# Patient Record
Sex: Female | Born: 1950 | Race: White | Hispanic: No | Marital: Single | Smoking: Former smoker
Health system: Southern US, Community
[De-identification: ages and names within clinical notes are randomized; demographics above are authoritative.]

## PROBLEM LIST (undated history)

## (undated) DIAGNOSIS — E119 Type 2 diabetes mellitus without complications: Secondary | ICD-10-CM

## (undated) DIAGNOSIS — E785 Hyperlipidemia, unspecified: Secondary | ICD-10-CM

## (undated) DIAGNOSIS — I4891 Unspecified atrial fibrillation: Secondary | ICD-10-CM

## (undated) DIAGNOSIS — I2510 Atherosclerotic heart disease of native coronary artery without angina pectoris: Secondary | ICD-10-CM

## (undated) HISTORY — PX: TUBAL LIGATION: SHX77

## (undated) HISTORY — PX: CORONARY ARTERY BYPASS GRAFT: SHX141

## (undated) HISTORY — PX: ARM AMPUTATION THROUGH FOREARM: SHX553

---

## 2012-06-12 ENCOUNTER — Emergency Department: Payer: Self-pay | Admitting: Emergency Medicine

## 2015-07-27 ENCOUNTER — Other Ambulatory Visit: Payer: Self-pay | Admitting: Family Medicine

## 2015-07-27 DIAGNOSIS — M7989 Other specified soft tissue disorders: Secondary | ICD-10-CM

## 2015-07-28 ENCOUNTER — Ambulatory Visit
Admission: RE | Admit: 2015-07-28 | Discharge: 2015-07-28 | Disposition: A | Discharge status: Home / Self Care | Payer: Managed Care, Other (non HMO) | Source: Ambulatory Visit | Attending: Family Medicine | Admitting: Family Medicine

## 2015-07-28 DIAGNOSIS — M7989 Other specified soft tissue disorders: Secondary | ICD-10-CM | POA: Insufficient documentation

## 2018-11-01 ENCOUNTER — Inpatient Hospital Stay: Payer: Medicare Other | Admitting: Anesthesiology

## 2018-11-01 ENCOUNTER — Encounter: Payer: Self-pay | Admitting: Emergency Medicine

## 2018-11-01 ENCOUNTER — Emergency Department: Payer: Medicare Other

## 2018-11-01 ENCOUNTER — Inpatient Hospital Stay
Admission: EM | Admit: 2018-11-01 | Discharge: 2018-11-04 | DRG: 854 | Disposition: A | Discharge status: Home / Self Care | Payer: Medicare Other | Attending: Internal Medicine | Admitting: Internal Medicine

## 2018-11-01 ENCOUNTER — Encounter
Admission: EM | Disposition: A | Discharge status: Home / Self Care | Payer: Self-pay | Source: Home / Self Care | Attending: Internal Medicine

## 2018-11-01 DIAGNOSIS — Z79890 Hormone replacement therapy (postmenopausal): Secondary | ICD-10-CM

## 2018-11-01 DIAGNOSIS — I4891 Unspecified atrial fibrillation: Secondary | ICD-10-CM | POA: Diagnosis present

## 2018-11-01 DIAGNOSIS — N200 Calculus of kidney: Secondary | ICD-10-CM

## 2018-11-01 DIAGNOSIS — I2510 Atherosclerotic heart disease of native coronary artery without angina pectoris: Secondary | ICD-10-CM | POA: Diagnosis present

## 2018-11-01 DIAGNOSIS — N1339 Other hydronephrosis: Secondary | ICD-10-CM | POA: Diagnosis not present

## 2018-11-01 DIAGNOSIS — Z7984 Long term (current) use of oral hypoglycemic drugs: Secondary | ICD-10-CM

## 2018-11-01 DIAGNOSIS — Z951 Presence of aortocoronary bypass graft: Secondary | ICD-10-CM | POA: Diagnosis not present

## 2018-11-01 DIAGNOSIS — Z87891 Personal history of nicotine dependence: Secondary | ICD-10-CM | POA: Diagnosis not present

## 2018-11-01 DIAGNOSIS — E039 Hypothyroidism, unspecified: Secondary | ICD-10-CM | POA: Diagnosis present

## 2018-11-01 DIAGNOSIS — Z6841 Body Mass Index (BMI) 40.0 and over, adult: Secondary | ICD-10-CM | POA: Diagnosis not present

## 2018-11-01 DIAGNOSIS — E785 Hyperlipidemia, unspecified: Secondary | ICD-10-CM

## 2018-11-01 DIAGNOSIS — B9620 Unspecified Escherichia coli [E. coli] as the cause of diseases classified elsewhere: Secondary | ICD-10-CM | POA: Diagnosis present

## 2018-11-01 DIAGNOSIS — N136 Pyonephrosis: Secondary | ICD-10-CM | POA: Diagnosis present

## 2018-11-01 DIAGNOSIS — N390 Urinary tract infection, site not specified: Secondary | ICD-10-CM

## 2018-11-01 DIAGNOSIS — E669 Obesity, unspecified: Secondary | ICD-10-CM | POA: Diagnosis present

## 2018-11-01 DIAGNOSIS — R1032 Left lower quadrant pain: Secondary | ICD-10-CM | POA: Diagnosis present

## 2018-11-01 DIAGNOSIS — E119 Type 2 diabetes mellitus without complications: Secondary | ICD-10-CM | POA: Diagnosis present

## 2018-11-01 DIAGNOSIS — N201 Calculus of ureter: Secondary | ICD-10-CM | POA: Diagnosis not present

## 2018-11-01 DIAGNOSIS — Z7982 Long term (current) use of aspirin: Secondary | ICD-10-CM

## 2018-11-01 DIAGNOSIS — Q6211 Congenital occlusion of ureteropelvic junction: Secondary | ICD-10-CM

## 2018-11-01 DIAGNOSIS — N132 Hydronephrosis with renal and ureteral calculous obstruction: Secondary | ICD-10-CM | POA: Diagnosis not present

## 2018-11-01 DIAGNOSIS — A419 Sepsis, unspecified organism: Principal | ICD-10-CM

## 2018-11-01 DIAGNOSIS — N10 Acute tubulo-interstitial nephritis: Secondary | ICD-10-CM

## 2018-11-01 HISTORY — DX: Atherosclerotic heart disease of native coronary artery without angina pectoris: I25.10

## 2018-11-01 HISTORY — PX: CYSTOSCOPY WITH STENT PLACEMENT: SHX5790

## 2018-11-01 HISTORY — DX: Hyperlipidemia, unspecified: E78.5

## 2018-11-01 HISTORY — DX: Unspecified atrial fibrillation: I48.91

## 2018-11-01 HISTORY — DX: Type 2 diabetes mellitus without complications: E11.9

## 2018-11-01 LAB — CBC
HCT: 42 % (ref 36.0–46.0)
Hemoglobin: 13.9 g/dL (ref 12.0–15.0)
MCH: 30.6 pg (ref 26.0–34.0)
MCHC: 33.1 g/dL (ref 30.0–36.0)
MCV: 92.5 fL (ref 80.0–100.0)
Platelets: 366 10*3/uL (ref 150–400)
RBC: 4.54 MIL/uL (ref 3.87–5.11)
RDW: 13.1 % (ref 11.5–15.5)
WBC: 18 10*3/uL — ABNORMAL HIGH (ref 4.0–10.5)
nRBC: 0 % (ref 0.0–0.2)

## 2018-11-01 LAB — URINALYSIS, COMPLETE (UACMP) WITH MICROSCOPIC
Bilirubin Urine: NEGATIVE
Glucose, UA: NEGATIVE mg/dL
Ketones, ur: 5 mg/dL — AB
Nitrite: POSITIVE — AB
Protein, ur: 30 mg/dL — AB
Specific Gravity, Urine: 1.031 — ABNORMAL HIGH (ref 1.005–1.030)
WBC, UA: >50 WBC/hpf — ABNORMAL HIGH (ref 0–5)
pH: 5 (ref 5.0–8.0)

## 2018-11-01 LAB — COMPREHENSIVE METABOLIC PANEL
ALT: 30 U/L (ref 0–44)
AST: 25 U/L (ref 15–41)
Albumin: 4 g/dL (ref 3.5–5.0)
Alkaline Phosphatase: 48 U/L (ref 38–126)
Anion gap: 13 (ref 5–15)
BUN: 16 mg/dL (ref 8–23)
CO2: 22 mmol/L (ref 22–32)
Calcium: 9.3 mg/dL (ref 8.9–10.3)
Chloride: 103 mmol/L (ref 98–111)
Creatinine, Ser: 1.19 mg/dL — ABNORMAL HIGH (ref 0.44–1.00)
GFR calc Af Amer: 55 mL/min — ABNORMAL LOW (ref >60)
GFR calc non Af Amer: 48 mL/min — ABNORMAL LOW (ref >60)
Glucose, Bld: 154 mg/dL — ABNORMAL HIGH (ref 70–99)
Potassium: 3.4 mmol/L — ABNORMAL LOW (ref 3.5–5.1)
Sodium: 138 mmol/L (ref 135–145)
Total Bilirubin: 1.2 mg/dL (ref 0.3–1.2)
Total Protein: 7.4 g/dL (ref 6.5–8.1)

## 2018-11-01 LAB — LIPASE, BLOOD: Lipase: 21 U/L (ref 11–51)

## 2018-11-01 SURGERY — CYSTOSCOPY, WITH STENT INSERTION
Anesthesia: General | Laterality: Bilateral

## 2018-11-01 MED ORDER — MORPHINE SULFATE (PF) 4 MG/ML IV SOLN
4.0000 mg | INTRAVENOUS | Status: DC | PRN
  Administered 2018-11-01: 4 mg via INTRAVENOUS
  Filled 2018-11-01: qty 1

## 2018-11-01 MED ORDER — HYDROCODONE-ACETAMINOPHEN 5-325 MG PO TABS
1.0000 | ORAL_TABLET | Freq: Four times a day (QID) | ORAL | Status: DC | PRN
  Administered 2018-11-02: 2 via ORAL
  Administered 2018-11-03: 1 via ORAL
  Filled 2018-11-01: qty 2
  Filled 2018-11-01: qty 1

## 2018-11-01 MED ORDER — PROPOFOL 10 MG/ML IV BOLUS
INTRAVENOUS | Status: AC
  Filled 2018-11-01: qty 20

## 2018-11-01 MED ORDER — SUCCINYLCHOLINE CHLORIDE 20 MG/ML IJ SOLN
INTRAMUSCULAR | Status: DC | PRN
  Administered 2018-11-01: 120 mg via INTRAVENOUS

## 2018-11-01 MED ORDER — PHENYLEPHRINE HCL 10 MG/ML IJ SOLN
INTRAMUSCULAR | Status: DC | PRN
  Administered 2018-11-01: 100 ug via INTRAVENOUS
  Administered 2018-11-01 (×2): 200 ug via INTRAVENOUS

## 2018-11-01 MED ORDER — PROPOFOL 10 MG/ML IV BOLUS
INTRAVENOUS | Status: DC | PRN
  Administered 2018-11-01: 130 mg via INTRAVENOUS
  Administered 2018-11-01: 70 mg via INTRAVENOUS

## 2018-11-01 MED ORDER — FENTANYL CITRATE (PF) 100 MCG/2ML IJ SOLN
INTRAMUSCULAR | Status: AC
  Filled 2018-11-01: qty 2

## 2018-11-01 MED ORDER — ACETAMINOPHEN 10 MG/ML IV SOLN
1000.0000 mg | Freq: Once | INTRAVENOUS | Status: DC
  Filled 2018-11-01: qty 100

## 2018-11-01 MED ORDER — IOPAMIDOL (ISOVUE-200) INJECTION 41%
INTRAVENOUS | Status: DC | PRN
  Administered 2018-11-01: 7 mL via INTRAVENOUS

## 2018-11-01 MED ORDER — ROCURONIUM BROMIDE 100 MG/10ML IV SOLN
INTRAVENOUS | Status: DC | PRN
  Administered 2018-11-01: 10 mg via INTRAVENOUS

## 2018-11-01 MED ORDER — IOHEXOL 300 MG/ML  SOLN
100.0000 mL | Freq: Once | INTRAMUSCULAR | Status: AC | PRN
  Administered 2018-11-01: 100 mL via INTRAVENOUS

## 2018-11-01 MED ORDER — FENTANYL CITRATE (PF) 100 MCG/2ML IJ SOLN: 25.0000 ug | INTRAMUSCULAR | Status: DC | PRN

## 2018-11-01 MED ORDER — ONDANSETRON HCL 4 MG/2ML IJ SOLN: 4.0000 mg | Freq: Once | INTRAMUSCULAR | Status: DC | PRN

## 2018-11-01 MED ORDER — SODIUM CHLORIDE 0.9 % IV BOLUS
500.0000 mL | Freq: Once | INTRAVENOUS | Status: AC
  Administered 2018-11-01: 500 mL via INTRAVENOUS

## 2018-11-01 MED ORDER — SODIUM CHLORIDE 0.9 % IV SOLN
INTRAVENOUS | Status: DC | PRN
  Administered 2018-11-01: 23:00:00 via INTRAVENOUS

## 2018-11-01 MED ORDER — ONDANSETRON HCL 4 MG/2ML IJ SOLN
INTRAMUSCULAR | Status: DC | PRN
  Administered 2018-11-01: 4 mg via INTRAVENOUS

## 2018-11-01 MED ORDER — SODIUM CHLORIDE 0.9 % IV BOLUS
250.0000 mL | Freq: Once | INTRAVENOUS | Status: AC
  Administered 2018-11-01: 250 mL via INTRAVENOUS

## 2018-11-01 MED ORDER — FENTANYL CITRATE (PF) 100 MCG/2ML IJ SOLN
INTRAMUSCULAR | Status: DC | PRN
  Administered 2018-11-01 (×2): 50 ug via INTRAVENOUS

## 2018-11-01 MED ORDER — MIDAZOLAM HCL 2 MG/2ML IJ SOLN
INTRAMUSCULAR | Status: DC | PRN
  Administered 2018-11-01 (×2): 1 mg via INTRAVENOUS

## 2018-11-01 MED ORDER — LIDOCAINE HCL (CARDIAC) PF 100 MG/5ML IV SOSY
PREFILLED_SYRINGE | INTRAVENOUS | Status: DC | PRN
  Administered 2018-11-01: 100 mg via INTRAVENOUS

## 2018-11-01 MED ORDER — PROMETHAZINE HCL 25 MG/ML IJ SOLN
12.5000 mg | Freq: Four times a day (QID) | INTRAMUSCULAR | Status: DC | PRN
  Administered 2018-11-01: 12.5 mg via INTRAVENOUS
  Filled 2018-11-01: qty 1

## 2018-11-01 MED ORDER — MIDAZOLAM HCL 2 MG/2ML IJ SOLN
INTRAMUSCULAR | Status: AC
  Filled 2018-11-01: qty 2

## 2018-11-01 MED ORDER — SODIUM CHLORIDE 0.9 % IV SOLN
1.0000 g | Freq: Once | INTRAVENOUS | Status: AC
  Administered 2018-11-01: 1 g via INTRAVENOUS
  Filled 2018-11-01: qty 10

## 2018-11-01 MED ORDER — ONDANSETRON 4 MG PO TBDP
4.0000 mg | ORAL_TABLET | Freq: Once | ORAL | Status: AC
  Administered 2018-11-01: 4 mg via ORAL
  Filled 2018-11-01: qty 1

## 2018-11-01 SURGICAL SUPPLY — 21 items
BAG DRAIN CYSTO-URO LG1000N (MISCELLANEOUS) ×2 IMPLANT
BRUSH SCRUB EZ  4% CHG (MISCELLANEOUS)
BRUSH SCRUB EZ 4% CHG (MISCELLANEOUS) IMPLANT
CATH URETL 5X70 OPEN END (CATHETERS) ×2 IMPLANT
CNTNR SPEC 2.5X3XGRAD LEK (MISCELLANEOUS) ×2
CONT SPEC 4OZ STER OR WHT (MISCELLANEOUS) ×2
CONTAINER SPEC 2.5X3XGRAD LEK (MISCELLANEOUS) ×2 IMPLANT
GLOVE BIO SURGEON STRL SZ8 (GLOVE) ×4 IMPLANT
GOWN STRL REUS W/ TWL XL LVL3 (GOWN DISPOSABLE) ×2 IMPLANT
GOWN STRL REUS W/TWL XL LVL3 (GOWN DISPOSABLE) ×2
GUIDEWIRE STR DUAL SENSOR (WIRE) ×2 IMPLANT
KIT TURNOVER CYSTO (KITS) ×2 IMPLANT
PACK CYSTO AR (MISCELLANEOUS) ×2 IMPLANT
SET CYSTO W/LG BORE CLAMP LF (SET/KITS/TRAYS/PACK) ×2 IMPLANT
SOL .9 NS 3000ML IRR  AL (IV SOLUTION) ×1
SOL .9 NS 3000ML IRR UROMATIC (IV SOLUTION) ×1 IMPLANT
STENT URET 6FRX24 CONTOUR (STENTS) ×4 IMPLANT
STENT URET 6FRX26 CONTOUR (STENTS) IMPLANT
SURGILUBE 2OZ TUBE FLIPTOP (MISCELLANEOUS) ×2 IMPLANT
SYR 20CC LL (SYRINGE) ×4 IMPLANT
WATER STERILE IRR 1000ML POUR (IV SOLUTION) ×2 IMPLANT

## 2018-11-01 NOTE — Op Note (Signed)
Preoperative diagnosis:  1. Left UPJ calculus-obstructing 2. Right UPJ calculus-obstructing 3. Pyelonephritis  Postoperative diagnosis:  1. Left UPJ calculus-obstructing 2. Right UPJ calculus-obstructing 3. Left pyonephrosis 4. Pyelonephritis  Procedure:  1. Cystoscopy 2. Bilateral ureteral stent placement (6FR) 24 cm 3. Bilateral retrograde pyelography with interpretation   Surgeon: Lorin Picket C. Stoioff, M.D.  Anesthesia: General  Complications: None  Intraoperative findings: 1.  Bilateral obstructing UPJ calculi 2.  Left pyonephrosis 3.  Left retrograde pyelogram-mild left hydronephrosis secondary to UPJ calculus 4.  Right retrograde pyelogram-moderate hydronephrosis secondary to UPJ calculus  EBL: Minimal  Specimens:  1.  Urine culture left renal pelvis 2.  Urine culture right renal pelvis  Indication: Sarah Brady is a 68 y.o. female presenting with a left lower quadrant abdominal pain, subjective fever, nausea and vomiting.  She presented to the ED where CT showed a 10 mm right UPJ calculus and a 16 mm left UPJ calculus with mild to moderate hydronephrosis.  Urinalysis was nitrite positive with pyuria and she had leukocytosis with a WBC of 18,000.  After reviewing the management options for treatment, he elected to proceed with the above surgical procedure(s). We have discussed the potential benefits and risks of the procedure, side effects of the proposed treatment, the likelihood of the patient achieving the goals of the procedure, and any potential problems that might occur during the procedure or recuperation. Informed consent has been obtained.  Description of procedure:  The patient was taken to the operating room and general anesthesia was induced.  The patient was placed in the dorsal lithotomy position, prepped and draped in the usual sterile fashion, and preoperative antibiotics were administered. A preoperative time-out was performed.   Cystourethroscopy was  performed.  The patient's urethra was examined and was normal. The bladder was then systematically examined in its entirety. There was no evidence for any bladder tumors, stones, or other mucosal pathology.  The ureteral orifice ease were normal appearing bilaterally and no efflux was seen after approximately 60 seconds of observation.  Attention then turned to the left ureteral orifice and a 0.038 sensor wire was placed to the cystoscope and into the left ureteral orifice and advanced to the renal pelvis under fluoroscopic guidance.  She had received IV contrast for her CT and bilateral hydronephrosis was noted on fluoroscopy.  A 5 French open-ended ureteral catheter was placed over the guidewire and advanced to the renal pelvis.  The guidewire was removed and thick purulent urine was aspirated from the renal pelvis.  Retrograde pyelogram was performed with findings as described above.  The guidewire was replaced and the ureteral catheter was removed.    A 6 French/24 cm double-J ureteral stent was then placed over the guidewire with good positioning noted in the renal pelvis under fluoroscopy and in the bladder under direct vision.    Attention was then directed to the right ureteral orifice.  A guidewire and ureteral catheter were placed in a similar fashion.  Yellow, slightly cloudy urine was aspirated from the right renal pelvis and sent for culture.  Right retrograde pyelogram was then performed with findings described above.  The guidewire was placed and the ureteral catheter was removed.  A 6 French/24 cm double-J ureteral stent was then placed over the guidewire with good positioning noted in the renal pelvis under fluoroscopy and in the bladder under direct vision.  The bladder was then emptied and the procedure ended.  The patient appeared to tolerate the procedure well and without complications.  After  anesthetic reversal patient was transported to the PACU in stable condition.   Irineo Axon, MD

## 2018-11-01 NOTE — H&P (Signed)
[facility] [place] at [facility]   PATIENT NAME: Sarah Brady    MR#:  [id number]  DATE OF BIRTH:  02-06-1951  DATE OF ADMISSION:  11/01/2018  PRIMARY CARE PHYSICIAN: Verner Mould, MD   REQUESTING/REFERRING PHYSICIAN: Roxan Hockey, MD  CHIEF COMPLAINT:   Chief Complaint  Patient presents with  . Abdominal Pain  . Nausea    HISTORY OF PRESENT ILLNESS:  Sarah Brady  is a 68 y.o. female who presents with chief complaint as above.  Patient presents the ED with bilateral abdominal and back pain.  She is found here to have bilateral obstructing kidney stones and UTI.  Hospitalist were called for admission after antibiotic administration was started in the ED and urology was contacted.  Plan is for bilateral stent placement by urology in the OR tonight.  PAST MEDICAL HISTORY:   Past Medical History:  Diagnosis Date  . Atrial fibrillation (HCC)   . CAD (coronary artery disease)   . Diabetes mellitus without complication (HCC)   . HLD (hyperlipidemia)      PAST SURGICAL HISTORY:   Past Surgical History:  Procedure Laterality Date  . CORONARY ARTERY BYPASS GRAFT    . TUBAL LIGATION       SOCIAL HISTORY:   Social History   Tobacco Use  . Smoking status: Former Smoker  Substance Use Topics  . Alcohol use: Yes    Alcohol/week: 1.0 standard drinks    Types: 1 Glasses of wine per week     FAMILY HISTORY:    Family history reviewed and is non-contributory DRUG ALLERGIES:  Not on File  MEDICATIONS AT HOME:   Prior to Admission medications   Not on File    REVIEW OF SYSTEMS:  Review of Systems  Constitutional: Positive for fever and malaise/fatigue. Negative for chills and weight loss.  HENT: Negative for ear pain, hearing loss and tinnitus.   Eyes: Negative for blurred vision, double vision, pain and redness.  Respiratory: Negative for cough, hemoptysis and shortness of breath.   Cardiovascular: Negative for chest pain,  palpitations, orthopnea and leg swelling.  Gastrointestinal: Negative for abdominal pain, constipation, diarrhea, nausea and vomiting.  Genitourinary: Positive for flank pain. Negative for dysuria, frequency and hematuria.  Musculoskeletal: Positive for back pain. Negative for joint pain and neck pain.  Skin:       No acne, rash, or lesions  Neurological: Negative for dizziness, tremors, focal weakness and weakness.  Endo/Heme/Allergies: Negative for polydipsia. Does not bruise/bleed easily.  Psychiatric/Behavioral: Negative for depression. The patient is not nervous/anxious and does not have insomnia.      VITAL SIGNS:   Vitals:   11/01/18 1930 11/01/18 2030 11/01/18 2039 11/01/18 2145  BP: (!) 141/75 (!) 146/73    Pulse: 98 99    Resp: (!) 21 16    Temp:    100.3 F (37.9 C)  TempSrc:    Oral  SpO2:   97%   Weight:      Height:       Wt Readings from Last 3 Encounters:  11/01/18 113.4 kg    PHYSICAL EXAMINATION:  Physical Exam  Vitals reviewed. Constitutional: She is oriented to person, place, and time. She appears well-developed and well-nourished. No distress.  HENT:  Head: Normocephalic and atraumatic.  Mouth/Throat: Oropharynx is clear and moist.  Eyes: Pupils are equal, round, and reactive to light. Conjunctivae and EOM are normal. No scleral icterus.  Neck: Normal range of motion. Neck supple. No JVD present.  No thyromegaly present.  Cardiovascular: Regular rhythm and intact distal pulses. Exam reveals no gallop and no friction rub.  No murmur heard. Borderline tachycardic  Respiratory: Effort normal and breath sounds normal. No respiratory distress. She has no wheezes. She has no rales.  GI: Soft. Bowel sounds are normal. She exhibits no distension. There is abdominal tenderness.  Musculoskeletal: Normal range of motion.        General: No edema.     Comments: No arthritis, no gout  Lymphadenopathy:    She has no cervical adenopathy.  Neurological: She is  alert and oriented to person, place, and time. No cranial nerve deficit.  No dysarthria, no aphasia  Skin: Skin is warm and dry. No rash noted. No erythema.  Psychiatric: She has a normal mood and affect. Her behavior is normal. Judgment and thought content normal.    LABORATORY PANEL:   CBC Recent Labs  Lab 11/01/18 1451  WBC 18.0*  HGB 13.9  HCT 42.0  PLT 366   ------------------------------------------------------------------------------------------------------------------  Chemistries  Recent Labs  Lab 11/01/18 1451  NA 138  K 3.4*  CL 103  CO2 22  GLUCOSE 154*  BUN 16  CREATININE 1.19*  CALCIUM 9.3  AST 25  ALT 30  ALKPHOS 48  BILITOT 1.2   ------------------------------------------------------------------------------------------------------------------  Cardiac Enzymes No results for input(s): TROPONINI in the last 168 hours. ------------------------------------------------------------------------------------------------------------------  RADIOLOGY:  Ct Abdomen Pelvis W Contrast  Result Date: 11/01/2018 CLINICAL DATA:  Nausea, vomiting, and left upper and lower abdominal/flank pain. EXAM: CT ABDOMEN AND PELVIS WITH CONTRAST TECHNIQUE: Multidetector CT imaging of the abdomen and pelvis was performed using the standard protocol following bolus administration of intravenous contrast. CONTRAST:  100mL OMNIPAQUE IOHEXOL 300 MG/ML  SOLN COMPARISON:  08/16/2009 FINDINGS: Lower chest: Mild atelectasis/scarring in the lung bases. Prior CABG. Coronary artery atherosclerosis. No pleural effusion. Hepatobiliary: Diffusely decreased attenuation of the liver consistent with steatosis. 3 cm stone in the gallbladder without evidence of acute cholecystitis or biliary dilatation. Pancreas: Unremarkable. Spleen: Unremarkable. Adrenals/Urinary Tract: Unremarkable adrenal glands. New bilateral ureteropelvic junction stones measure 10 mm on the right and approximately 16 mm on the left.  There is mild-to-moderate right greater than left hydronephrosis. Additional small calculi are present in the lower poles of both kidneys. There is asymmetric left perinephric stranding, and the left kidney appears edematous with diffuse hypoenhancement relative to the right and no contrast excretion on delayed imaging. No ureteral dilatation or ureteral calculi are identified, although the proximal right ureter appears mildly thick-walled near the UPJ stone. The bladder is unremarkable. Stomach/Bowel: There is a small sliding hiatal hernia. The stomach is decompressed. There is no evidence of bowel obstruction or inflammation. The appendix is likely located inferior to the cecum incompletely collapse without evidence of appendicitis. Vascular/Lymphatic: Aortic atherosclerosis without aneurysm. Increased number of subcentimeter aortocaval and left periaortic lymph nodes, most likely reactive. Reproductive: Low density centrally in the uterus which may reflect endometrial thickening or fluid with more bulbous low-density enlargement of the cervix. Three separate rounded intermediate density masses in the pelvis with the largest measuring 12 x 11 cm and likely arising from the right adnexa. The mass contacts and mildly deforms the uterine fundus. Unremarkable left adnexa. Other: No intraperitoneal free fluid. Musculoskeletal: No acute osseous abnormality or suspicious osseous lesion. IMPRESSION: 1. Bilateral ureteropelvic junction stones with mild-to-moderate bilateral hydronephrosis. Asymmetric left renal edema and lack of left renal excretion. 2. Bilateral nonobstructing renal calculi. 3. Multiple pelvic masses measuring up to 12 cm  in size and possibly arising from the right ovary. Possible endometrial thickening or mass. Pelvic ultrasound is recommended to further evaluate these findings. 4. Hepatic steatosis. 5. Cholelithiasis. 6.  Aortic Atherosclerosis (ICD10-I70.0). Electronically Signed   By: Sebastian Ache  M.D.   On: 11/01/2018 20:50    EKG:   Orders placed or performed during the hospital encounter of 11/01/18  . ED EKG  . ED EKG    IMPRESSION AND PLAN:  Active Problems:   Sepsis (HCC) -due to her UTI.  IV antibiotics given, blood pressure stable, urology contacted for treatment of kidney stones as below.   Kidney stone -bilaterally, urology plans for bilateral stent placement tonight   UTI (urinary tract infection) -IV antibiotics, urine culture sent   CAD (coronary artery disease) -continue home meds   HLD (hyperlipidemia) -home dose antilipid  Chart review performed and case discussed with ED provider. Labs, imaging and/or ECG reviewed by provider and discussed with patient/family. Management plans discussed with the patient and/or family.  DVT PROPHYLAXIS: SubQ lovenox   GI PROPHYLAXIS:  None  ADMISSION STATUS: Inpatient     CODE STATUS: Full  TOTAL TIME TAKING CARE OF THIS PATIENT: 45 minutes.   Barney Drain 11/01/2018, 9:54 PM  [facility]  Office  [phone]  CC: Primary care physician; Verner Mould, MD  Note:  This document was prepared using Dragon voice recognition software and may include unintentional dictation errors.

## 2018-11-01 NOTE — Anesthesia Post-op Follow-up Note (Signed)
Anesthesia QCDR form completed.        

## 2018-11-01 NOTE — ED Notes (Signed)
Patient transported to CT 

## 2018-11-01 NOTE — Consult Note (Signed)
Urology Consult  Chief Complaint: Abdominal pain  History of Present Illness: Sarah Brady is a 68 y.o. year old female seen in consultation at request of Dr. Roxan Hockey for bilateral UPJ calculi.  Earlier this week she had onset of left lower quadrant abdominal pain radiating to the left flank.  As the week progressed her pain increased and was associated with nausea and vomiting.  She has had subjective low-grade fever.  Due to increasing pain, nausea and vomiting she presented to the ED this evening.  A stone protocol CT of the abdomen and pelvis was performed which showed bilateral obstructing UPJ calculi with mild to moderate bilateral hydronephrosis R >L.  Urinalysis was nitrite positive with pyuria and WBC was elevated at 18,000.  She also had bilateral nonobstructing lower pole urinary calculi.  She has a previous history of stone disease not requiring intervention.  She received IV Rocephin in the ED.  Past Medical History:  Diagnosis Date  . Atrial fibrillation (HCC)   . CAD (coronary artery disease)   . Diabetes mellitus without complication (HCC)   . HLD (hyperlipidemia)     Past Surgical History:  Procedure Laterality Date  . CORONARY ARTERY BYPASS GRAFT    . TUBAL LIGATION      Home Medications:  Current Meds  Medication Sig  . atorvastatin (LIPITOR) 80 MG tablet Take 80 mg by mouth daily.  Marland Kitchen buPROPion (WELLBUTRIN XL) 150 MG 24 hr tablet Take 150 mg by mouth daily.  Marland Kitchen buPROPion (WELLBUTRIN XL) 300 MG 24 hr tablet Take 300 mg by mouth daily.  . clobetasol ointment (TEMOVATE) 0.05 % Apply 0.05 application topically as needed.  . furosemide (LASIX) 40 MG tablet Take 40 mg by mouth 2 (two) times daily.  Marland Kitchen levothyroxine (SYNTHROID, LEVOTHROID) 125 MCG tablet Take 125 mcg by mouth daily.  . metFORMIN (GLUCOPHAGE-XR) 500 MG 24 hr tablet Take 1,000 mg by mouth daily. With largest meal of the day.  . metoprolol tartrate (LOPRESSOR) 25 MG tablet Take 25 mg by mouth 2  (two) times daily.  . potassium chloride (K-DUR,KLOR-CON) 10 MEQ tablet Take 10 mEq by mouth 2 (two) times daily.  . Vitamin D, Ergocalciferol, (DRISDOL) 1.25 MG (50000 UT) CAPS capsule Take 50,000 Units by mouth 2 (two) times a week.    Allergies: Not on File  No family history on file.  Social History:  reports that she has quit smoking. She does not have any smokeless tobacco history on file. She reports current alcohol use of about 1.0 standard drinks of alcohol per week. She reports previous drug use.  ROS: A complete review of systems was performed.  All systems are negative except for pertinent findings as noted.  Physical Exam:  Vital signs in last 24 hours: Temp:  [100.3 F (37.9 C)] 100.3 F (37.9 C) (02/20 2145) Pulse Rate:  [98-99] 99 (02/20 2130) Resp:  [16-30] 30 (02/20 2130) BP: (141-152)/(68-97) 152/97 (02/20 2130) SpO2:  [88 %-97 %] 94 % (02/20 2130) Weight:  [113.4 kg] 113.4 kg (02/20 1444) Constitutional:  Alert and oriented, No acute distress HEENT: [place] AT, moist mucus membranes.  Trachea midline, no masses Cardiovascular: Regular rate and rhythm, no clubbing, cyanosis, or edema. Respiratory: Normal respiratory effort, lungs clear bilaterally GI: Abdomen is soft, mild left lower quadrant tenderness, nondistended, no abdominal masses GU: No CVA tenderness Skin: No rashes, bruises or suspicious lesions Lymph: No cervical or inguinal adenopathy Neurologic: Grossly intact, no focal deficits, moving all 4 extremities Psychiatric:  Normal mood and affect   Laboratory Data:  Recent Labs    11/01/18 1451  WBC 18.0*  HGB 13.9  HCT 42.0   Recent Labs    11/01/18 1451  NA 138  K 3.4*  CL 103  CO2 22  GLUCOSE 154*  BUN 16  CREATININE 1.19*  CALCIUM 9.3    Radiologic Imaging: CT images were personally reviewed  Ct Abdomen Pelvis W Contrast  Result Date: 11/01/2018 CLINICAL DATA:  Nausea, vomiting, and left upper and lower abdominal/flank pain. EXAM:  CT ABDOMEN AND PELVIS WITH CONTRAST TECHNIQUE: Multidetector CT imaging of the abdomen and pelvis was performed using the standard protocol following bolus administration of intravenous contrast. CONTRAST:  100mL OMNIPAQUE IOHEXOL 300 MG/ML  SOLN COMPARISON:  08/16/2009 FINDINGS: Lower chest: Mild atelectasis/scarring in the lung bases. Prior CABG. Coronary artery atherosclerosis. No pleural effusion. Hepatobiliary: Diffusely decreased attenuation of the liver consistent with steatosis. 3 cm stone in the gallbladder without evidence of acute cholecystitis or biliary dilatation. Pancreas: Unremarkable. Spleen: Unremarkable. Adrenals/Urinary Tract: Unremarkable adrenal glands. New bilateral ureteropelvic junction stones measure 10 mm on the right and approximately 16 mm on the left. There is mild-to-moderate right greater than left hydronephrosis. Additional small calculi are present in the lower poles of both kidneys. There is asymmetric left perinephric stranding, and the left kidney appears edematous with diffuse hypoenhancement relative to the right and no contrast excretion on delayed imaging. No ureteral dilatation or ureteral calculi are identified, although the proximal right ureter appears mildly thick-walled near the UPJ stone. The bladder is unremarkable. Stomach/Bowel: There is a small sliding hiatal hernia. The stomach is decompressed. There is no evidence of bowel obstruction or inflammation. The appendix is likely located inferior to the cecum incompletely collapse without evidence of appendicitis. Vascular/Lymphatic: Aortic atherosclerosis without aneurysm. Increased number of subcentimeter aortocaval and left periaortic lymph nodes, most likely reactive. Reproductive: Low density centrally in the uterus which may reflect endometrial thickening or fluid with more bulbous low-density enlargement of the cervix. Three separate rounded intermediate density masses in the pelvis with the largest measuring 12  x 11 cm and likely arising from the right adnexa. The mass contacts and mildly deforms the uterine fundus. Unremarkable left adnexa. Other: No intraperitoneal free fluid. Musculoskeletal: No acute osseous abnormality or suspicious osseous lesion. IMPRESSION: 1. Bilateral ureteropelvic junction stones with mild-to-moderate bilateral hydronephrosis. Asymmetric left renal edema and lack of left renal excretion. 2. Bilateral nonobstructing renal calculi. 3. Multiple pelvic masses measuring up to 12 cm in size and possibly arising from the right ovary. Possible endometrial thickening or mass. Pelvic ultrasound is recommended to further evaluate these findings. 4. Hepatic steatosis. 5. Cholelithiasis. 6.  Aortic Atherosclerosis (ICD10-I70.0). Electronically Signed   By: Sebastian AcheAllen  Grady M.D.   On: 11/01/2018 20:50    Impression/Assessment:  68 year old female with bilateral UPJ calculi and mild to moderate hydronephrosis R>L.  She has a low-grade temp, leukocytosis and UA is nitrite positive with pyuria and microhematuria.  Plan:  Recommend cystoscopy with placement bilateral ureteral stents urgently.  The procedure was discussed in detail including potential risks of bleeding, ureteral injury and infection/worsening sepsis.  It was stressed that no attempt will be made to remove her calculi and that she will need follow-up definitive treatment of her stones.  The potential need for percutaneous nephrostomy tube(s) was also discussed in the event antegrade stent placement is not successful.  She indicated all questions were answered to her satisfaction and desires to proceed.  11/01/2018, 10:41  PM  Irineo Axon,  MD

## 2018-11-01 NOTE — Anesthesia Preprocedure Evaluation (Signed)
Anesthesia Evaluation  Patient identified by MRN, date of birth, ID band Patient awake    Reviewed: Allergy & Precautions, NPO status , Patient's Chart, lab work & pertinent test results, reviewed documented beta blocker date and time   Airway Mallampati: III  TM Distance: <3 FB     Dental  (+) Upper Dentures, Partial Lower   Pulmonary former smoker,    Pulmonary exam normal        Cardiovascular hypertension, Pt. on medications and Pt. on home beta blockers + CAD  Normal cardiovascular exam+ dysrhythmias Atrial Fibrillation      Neuro/Psych negative neurological ROS  negative psych ROS   GI/Hepatic   Endo/Other  diabetes  Renal/GU stones     Musculoskeletal   Abdominal (+) + obese,   Peds negative pediatric ROS (+)  Hematology negative hematology ROS (+)   Anesthesia Other Findings Past Medical History: No date: Atrial fibrillation (HCC) No date: CAD (coronary artery disease) No date: Diabetes mellitus without complication (HCC) No date: HLD (hyperlipidemia)  Reproductive/Obstetrics                             Anesthesia Physical Anesthesia Plan  ASA: III and emergent  Anesthesia Plan: General   Post-op Pain Management:    Induction: Intravenous, Rapid sequence and Cricoid pressure planned  PONV Risk Score and Plan:   Airway Management Planned: Oral ETT  Additional Equipment:   Intra-op Plan:   Post-operative Plan: Extubation in OR  Informed Consent: I have reviewed the patients History and Physical, chart, labs and discussed the procedure including the risks, benefits and alternatives for the proposed anesthesia with the patient or authorized representative who has indicated his/her understanding and acceptance.     Dental advisory given  Plan Discussed with: CRNA and Surgeon  Anesthesia Plan Comments:         Anesthesia Quick Evaluation

## 2018-11-01 NOTE — ED Triage Notes (Signed)
Pt reports around midnight she started with some nausea, vomiting and pain to her left side abd. Pt points to left upper abd, flank and left lower abd as area of pain.

## 2018-11-01 NOTE — Anesthesia Procedure Notes (Signed)
Procedure Name: Intubation Date/Time: 11/01/2018 11:19 PM Performed by: Aline Brochure, CRNA Pre-anesthesia Checklist: Patient identified, Emergency Drugs available, Suction available and Patient being monitored Patient Re-evaluated:Patient Re-evaluated prior to induction Oxygen Delivery Method: Circle system utilized Preoxygenation: Pre-oxygenation with 100% oxygen Induction Type: IV induction and Cricoid Pressure applied Ventilation: Mask ventilation without difficulty Laryngoscope Size: Mac and 3 Grade View: Grade I Tube type: Oral Tube size: 7.0 mm Number of attempts: 1 Airway Equipment and Method: Stylet Placement Confirmation: ETT inserted through vocal cords under direct vision,  positive ETCO2 and breath sounds checked- equal and bilateral Secured at: 19 cm Tube secured with: Tape Dental Injury: Teeth and Oropharynx as per pre-operative assessment

## 2018-11-02 ENCOUNTER — Other Ambulatory Visit: Payer: Self-pay

## 2018-11-02 ENCOUNTER — Inpatient Hospital Stay: Payer: Medicare Other

## 2018-11-02 DIAGNOSIS — N201 Calculus of ureter: Secondary | ICD-10-CM

## 2018-11-02 DIAGNOSIS — N1339 Other hydronephrosis: Secondary | ICD-10-CM

## 2018-11-02 LAB — CBC
HCT: 36 % (ref 36.0–46.0)
Hemoglobin: 11.8 g/dL — ABNORMAL LOW (ref 12.0–15.0)
MCH: 30.6 pg (ref 26.0–34.0)
MCHC: 32.8 g/dL (ref 30.0–36.0)
MCV: 93.5 fL (ref 80.0–100.0)
Platelets: 308 10*3/uL (ref 150–400)
RBC: 3.85 MIL/uL — ABNORMAL LOW (ref 3.87–5.11)
RDW: 13.6 % (ref 11.5–15.5)
WBC: 19.2 10*3/uL — ABNORMAL HIGH (ref 4.0–10.5)
nRBC: 0 % (ref 0.0–0.2)

## 2018-11-02 LAB — BASIC METABOLIC PANEL
Anion gap: 12 (ref 5–15)
BUN: 13 mg/dL (ref 8–23)
CO2: 22 mmol/L (ref 22–32)
Calcium: 8.2 mg/dL — ABNORMAL LOW (ref 8.9–10.3)
Chloride: 104 mmol/L (ref 98–111)
Creatinine, Ser: 1.04 mg/dL — ABNORMAL HIGH (ref 0.44–1.00)
GFR calc Af Amer: >60 mL/min (ref >60)
GFR calc non Af Amer: 56 mL/min — ABNORMAL LOW (ref >60)
Glucose, Bld: 173 mg/dL — ABNORMAL HIGH (ref 70–99)
Potassium: 2.9 mmol/L — ABNORMAL LOW (ref 3.5–5.1)
Sodium: 138 mmol/L (ref 135–145)

## 2018-11-02 LAB — GLUCOSE, CAPILLARY
Glucose-Capillary: 157 mg/dL — ABNORMAL HIGH (ref 70–99)
Glucose-Capillary: 115 mg/dL — ABNORMAL HIGH (ref 70–99)
Glucose-Capillary: 96 mg/dL (ref 70–99)

## 2018-11-02 LAB — MAGNESIUM: Magnesium: 1.4 mg/dL — ABNORMAL LOW (ref 1.7–2.4)

## 2018-11-02 MED ORDER — MAGNESIUM SULFATE 2 GM/50ML IV SOLN
2.0000 g | Freq: Once | INTRAVENOUS | Status: AC
  Administered 2018-11-02: 2 g via INTRAVENOUS
  Filled 2018-11-02: qty 50

## 2018-11-02 MED ORDER — ENOXAPARIN SODIUM 40 MG/0.4ML ~~LOC~~ SOLN
40.0000 mg | SUBCUTANEOUS | Status: DC
  Administered 2018-11-02 – 2018-11-03 (×2): 40 mg via SUBCUTANEOUS
  Filled 2018-11-02 (×2): qty 0.4

## 2018-11-02 MED ORDER — BUPROPION HCL ER (XL) 150 MG PO TB24
150.0000 mg | ORAL_TABLET | Freq: Every day | ORAL | Status: DC
  Administered 2018-11-03 – 2018-11-04 (×2): 150 mg via ORAL
  Filled 2018-11-02 (×2): qty 1

## 2018-11-02 MED ORDER — POTASSIUM CHLORIDE CRYS ER 10 MEQ PO TBCR
10.0000 meq | EXTENDED_RELEASE_TABLET | Freq: Two times a day (BID) | ORAL | Status: DC
  Administered 2018-11-03 – 2018-11-04 (×3): 10 meq via ORAL
  Filled 2018-11-02 (×4): qty 1

## 2018-11-02 MED ORDER — OXYCODONE HCL 5 MG PO TABS: 5.0000 mg | ORAL_TABLET | ORAL | Status: DC | PRN

## 2018-11-02 MED ORDER — BUPROPION HCL ER (XL) 150 MG PO TB24
300.0000 mg | ORAL_TABLET | Freq: Every day | ORAL | Status: DC
  Administered 2018-11-03 – 2018-11-04 (×2): 300 mg via ORAL
  Filled 2018-11-02 (×2): qty 2

## 2018-11-02 MED ORDER — ATORVASTATIN CALCIUM 80 MG PO TABS
80.0000 mg | ORAL_TABLET | Freq: Every day | ORAL | Status: DC
  Administered 2018-11-02 – 2018-11-03 (×2): 80 mg via ORAL
  Filled 2018-11-02 (×3): qty 1
  Filled 2018-11-02 (×2): qty 4

## 2018-11-02 MED ORDER — ACETAMINOPHEN 650 MG RE SUPP: 650.0000 mg | Freq: Four times a day (QID) | RECTAL | Status: DC | PRN

## 2018-11-02 MED ORDER — FUROSEMIDE 40 MG PO TABS
40.0000 mg | ORAL_TABLET | Freq: Two times a day (BID) | ORAL | Status: DC
  Administered 2018-11-02 – 2018-11-04 (×4): 40 mg via ORAL
  Filled 2018-11-02 (×4): qty 1

## 2018-11-02 MED ORDER — SODIUM CHLORIDE 0.9 % IV SOLN
INTRAVENOUS | Status: DC
  Administered 2018-11-02: 15:00:00 via INTRAVENOUS

## 2018-11-02 MED ORDER — ONDANSETRON HCL 4 MG PO TABS: 4.0000 mg | ORAL_TABLET | Freq: Four times a day (QID) | ORAL | Status: DC | PRN

## 2018-11-02 MED ORDER — SODIUM CHLORIDE 0.9 % IV SOLN
1.0000 g | INTRAVENOUS | Status: DC
  Administered 2018-11-02: 1 g via INTRAVENOUS
  Filled 2018-11-02: qty 1
  Filled 2018-11-02: qty 10

## 2018-11-02 MED ORDER — VITAMIN D (ERGOCALCIFEROL) 1.25 MG (50000 UNIT) PO CAPS: 50000.0000 [IU] | ORAL_CAPSULE | ORAL | Status: DC

## 2018-11-02 MED ORDER — INSULIN ASPART 100 UNIT/ML ~~LOC~~ SOLN
0.0000 [IU] | Freq: Three times a day (TID) | SUBCUTANEOUS | Status: DC
  Administered 2018-11-02: 3 [IU] via SUBCUTANEOUS
  Administered 2018-11-04: 2 [IU] via SUBCUTANEOUS
  Filled 2018-11-02 (×2): qty 1

## 2018-11-02 MED ORDER — ACETAMINOPHEN 325 MG PO TABS: 650.0000 mg | ORAL_TABLET | Freq: Four times a day (QID) | ORAL | Status: DC | PRN

## 2018-11-02 MED ORDER — METOPROLOL TARTRATE 25 MG PO TABS
25.0000 mg | ORAL_TABLET | Freq: Two times a day (BID) | ORAL | Status: DC
  Administered 2018-11-02 – 2018-11-03 (×2): 25 mg via ORAL
  Filled 2018-11-02 (×2): qty 1

## 2018-11-02 MED ORDER — CLOBETASOL PROPIONATE 0.05 % EX OINT
1.0000 "application " | TOPICAL_OINTMENT | CUTANEOUS | Status: DC | PRN
  Filled 2018-11-02: qty 15

## 2018-11-02 MED ORDER — LEVOTHYROXINE SODIUM 125 MCG PO TABS
125.0000 ug | ORAL_TABLET | Freq: Every day | ORAL | Status: DC
  Administered 2018-11-03 – 2018-11-04 (×2): 125 ug via ORAL
  Filled 2018-11-02 (×2): qty 1

## 2018-11-02 MED ORDER — OXYBUTYNIN CHLORIDE 5 MG PO TABS: 5.0000 mg | ORAL_TABLET | Freq: Three times a day (TID) | ORAL | Status: DC | PRN

## 2018-11-02 MED ORDER — SODIUM CHLORIDE 0.9 % IV BOLUS
1000.0000 mL | Freq: Once | INTRAVENOUS | Status: AC
  Administered 2018-11-02: 1000 mL via INTRAVENOUS

## 2018-11-02 MED ORDER — POTASSIUM CHLORIDE 20 MEQ PO PACK
60.0000 meq | PACK | Freq: Once | ORAL | Status: AC
  Administered 2018-11-02: 60 meq via ORAL
  Filled 2018-11-02: qty 3

## 2018-11-02 MED ORDER — ONDANSETRON HCL 4 MG/2ML IJ SOLN: 4.0000 mg | Freq: Four times a day (QID) | INTRAMUSCULAR | Status: DC | PRN

## 2018-11-02 MED ORDER — ASPIRIN 81 MG PO CHEW
81.0000 mg | CHEWABLE_TABLET | Freq: Every day | ORAL | Status: DC
  Administered 2018-11-03 – 2018-11-04 (×2): 81 mg via ORAL
  Filled 2018-11-02 (×2): qty 1

## 2018-11-02 MED ORDER — INSULIN ASPART 100 UNIT/ML ~~LOC~~ SOLN: 0.0000 [IU] | Freq: Every day | SUBCUTANEOUS | Status: DC

## 2018-11-02 NOTE — ED Provider Notes (Signed)
[facility] Emergency Department Provider Note    First MD Initiated Contact with Patient 11/01/18 2154     (approximate)  I have reviewed the triage vital signs and the nursing notes.   HISTORY  Chief Complaint Abdominal Pain and Nausea    HPI [name] is a 68 y.o. female below listed past medical history presents to the ER for evaluation of left lower quadrant pain is progressively worsening over the past several days.  Does feel like she is having some fevers and chills.  Has had several episodes of nausea with vomiting.  Has never had pain quite like this before.  No recent antibiotics.    Past Medical History:  Diagnosis Date  . Atrial fibrillation (HCC)   . CAD (coronary artery disease)   . Diabetes mellitus without complication (HCC)   . HLD (hyperlipidemia)    History reviewed. No pertinent family history. Past Surgical History:  Procedure Laterality Date  . CORONARY ARTERY BYPASS GRAFT    . TUBAL LIGATION     Patient Active Problem List   Diagnosis Date Noted  . Sepsis (HCC) 11/01/2018  . Kidney stone 11/01/2018  . UTI (urinary tract infection) 11/01/2018  . CAD (coronary artery disease) 11/01/2018  . HLD (hyperlipidemia) 11/01/2018      Prior to Admission medications   Medication Sig Start Date End Date Taking? Authorizing Provider  atorvastatin (LIPITOR) 80 MG tablet Take 80 mg by mouth daily. 10/04/18  Yes [provider]  buPROPion (WELLBUTRIN XL) 150 MG 24 hr tablet Take 150 mg by mouth daily. 10/23/18  Yes [provider]  buPROPion (WELLBUTRIN XL) 300 MG 24 hr tablet Take 300 mg by mouth daily. 09/22/18  Yes [provider]  clobetasol ointment (TEMOVATE) 0.05 % Apply 0.05 application topically as needed. 08/24/18 08/24/19 Yes [provider]  furosemide (LASIX) 40 MG tablet Take 40 mg by mouth 2 (two) times daily. 08/03/18  Yes [provider]  levothyroxine (SYNTHROID,  LEVOTHROID) 125 MCG tablet Take 125 mcg by mouth daily. 07/08/18  Yes [provider]  metFORMIN (GLUCOPHAGE-XR) 500 MG 24 hr tablet Take 1,000 mg by mouth daily. With largest meal of the day. 08/24/18  Yes [provider]  metoprolol tartrate (LOPRESSOR) 25 MG tablet Take 25 mg by mouth 2 (two) times daily. 08/03/18  Yes [provider]  potassium chloride (K-DUR,KLOR-CON) 10 MEQ tablet Take 10 mEq by mouth 2 (two) times daily. 08/24/18 08/24/19 Yes [provider]  Vitamin D, Ergocalciferol, (DRISDOL) 1.25 MG (50000 UT) CAPS capsule Take 50,000 Units by mouth 2 (two) times a week. 08/24/18 08/24/19 Yes [provider]  aspirin 81 MG chewable tablet Chew 81 mg by mouth daily with breakfast.    [provider]    Allergies Patient has no allergy information on record.    Social History Social History   Tobacco Use  . Smoking status: Former Smoker  Substance Use Topics  . Alcohol use: Yes    Alcohol/week: 1.0 standard drinks    Types: 1 Glasses of wine per week  . Drug use: Not Currently    Review of Systems Patient denies headaches, rhinorrhea, blurry vision, numbness, shortness of breath, chest pain, edema, cough, abdominal pain, nausea, vomiting, diarrhea, dysuria, fevers, rashes or hallucinations unless otherwise stated above in HPI. ____________________________________________   PHYSICAL EXAM:  VITAL SIGNS: Vitals:   11/01/18 2230 11/01/18 2359  BP: (!) 148/69 112/60  Pulse: 95 100  Resp: (!) 31 16  Temp:  98.1 F (36.7 C)  SpO2: 93% 99%    Constitutional: Alert and oriented.  Eyes: Conjunctivae are normal.  Head: Atraumatic. Nose: No congestion/rhinnorhea. Mouth/Throat: Mucous membranes are moist.   Neck: No stridor. Painless ROM.  Cardiovascular: Normal rate, regular rhythm. Grossly normal heart sounds.  Good peripheral circulation. Respiratory: Normal respiratory effort.  No retractions. Lungs  CTAB. Gastrointestinal: Soft mild left lower quadrant tenderness to palpation. No distention. No abdominal bruits. No CVA tenderness. Genitourinary: deferred Musculoskeletal: No lower extremity tenderness nor edema.  No joint effusions. Neurologic:  Normal speech and language. No gross focal neurologic deficits are appreciated. No facial droop Skin:  Skin is warm, dry and intact. No rash noted. Psychiatric: Mood and affect are normal. Speech and behavior are normal.  ____________________________________________   LABS (all labs ordered are listed, but only abnormal results are displayed)  Results for orders placed or performed during the hospital encounter of 11/01/18 (from the past 24 hour(s))  Lipase, blood     Status: None   Collection Time: 11/01/18  2:51 PM  Result Value Ref Range   Lipase 21 11 - 51 U/L  Comprehensive metabolic panel     Status: Abnormal   Collection Time: 11/01/18  2:51 PM  Result Value Ref Range   Sodium 138 135 - 145 mmol/L   Potassium 3.4 (L) 3.5 - 5.1 mmol/L   Chloride 103 98 - 111 mmol/L   CO2 22 22 - 32 mmol/L   Glucose, Bld 154 (H) 70 - 99 mg/dL   BUN 16 8 - 23 mg/dL   Creatinine, Ser 0.93 (H) 0.44 - 1.00 mg/dL   Calcium 9.3 8.9 - 26.7 mg/dL   Total Protein 7.4 6.5 - 8.1 g/dL   Albumin 4.0 3.5 - 5.0 g/dL   AST 25 15 - 41 U/L   ALT 30 0 - 44 U/L   Alkaline Phosphatase 48 38 - 126 U/L   Total Bilirubin 1.2 0.3 - 1.2 mg/dL   GFR calc non Af Amer 48 (L) >60 mL/min   GFR calc Af Amer 55 (L) >60 mL/min   Anion gap 13 5 - 15  CBC     Status: Abnormal   Collection Time: 11/01/18  2:51 PM  Result Value Ref Range   WBC 18.0 (H) 4.0 - 10.5 K/uL   RBC 4.54 3.87 - 5.11 MIL/uL   Hemoglobin 13.9 12.0 - 15.0 g/dL   HCT 12.4 58.0 - 99.8 %   MCV 92.5 80.0 - 100.0 fL   MCH 30.6 26.0 - 34.0 pg   MCHC 33.1 30.0 - 36.0 g/dL   RDW 33.8 25.0 - 53.9 %   Platelets 366 150 - 400 K/uL   nRBC 0.0 0.0 - 0.2 %  Urinalysis, Complete w Microscopic     Status: Abnormal    Collection Time: 11/01/18  9:18 PM  Result Value Ref Range   Color, Urine YELLOW (A) YELLOW   APPearance CLOUDY (A) CLEAR   Specific Gravity, Urine 1.031 (H) 1.005 - 1.030   pH 5.0 5.0 - 8.0   Glucose, UA NEGATIVE NEGATIVE mg/dL   Hgb urine dipstick MODERATE (A) NEGATIVE   Bilirubin Urine NEGATIVE NEGATIVE   Ketones, ur 5 (A) NEGATIVE mg/dL   Protein, ur 30 (A) NEGATIVE mg/dL   Nitrite POSITIVE (A) NEGATIVE   Leukocytes,Ua LARGE (A) NEGATIVE   RBC / HPF 21-50 0 - 5 RBC/hpf   WBC, UA >50 (H) 0 - 5 WBC/hpf   Bacteria, UA MANY (A)  NONE SEEN   Squamous Epithelial / LPF 6-10 0 - 5   WBC Clumps PRESENT    Non Squamous Epithelial PRESENT (A) NONE SEEN   ____________________________________________  EKG My review and personal interpretation at Time: 14:50   Indication: abd pain  Rate: 70  Rhythm: sinus Axis: normal Other: normal intervals, no stemi ____________________________________________  RADIOLOGY  I personally reviewed all radiographic images ordered to evaluate for the above acute complaints and reviewed radiology reports and findings.  These findings were personally discussed with the patient.  Please see medical record for radiology report.  ____________________________________________   PROCEDURES  Procedure(s) performed:  .Critical Care Performed by: Willy Eddyobinson, Patrick, MD Authorized by: Willy Eddyobinson, Patrick, MD   Critical care provider statement:    Critical care time (minutes):  35   Critical care was necessary to treat or prevent imminent or life-threatening deterioration of the following conditions:  Sepsis   Critical care was time spent personally by me on the following activities:  Discussions with consultants, evaluation of patient's response to treatment, examination of patient, ordering and performing treatments and interventions, ordering and review of laboratory studies, ordering and review of radiographic studies, pulse oximetry, re-evaluation of patient's  condition, obtaining history from patient or surrogate and review of old charts      Critical Care performed: yes ____________________________________________   INITIAL IMPRESSION / ASSESSMENT AND PLAN / ED COURSE  Pertinent labs & imaging results that were available during my care of the patient were reviewed by me and considered in my medical decision making (see chart for details).   DDX: Diverticulitis, SBO, UTI, pyelonephritis, stone, mass  Debbrah AlarSandra Verbeke is a 68 y.o. who presents to the ED with symptoms as described above.  Patient uncomfortable and ill-appearing.  Will provide IV fluids as well as check blood work.  Based on her symptoms will order CT imaging of the abdomen to evaluate for the above complaints.  Clinical Course as of Nov 02 4  Thu Nov 01, 2018  2142 With evidence of obstructing bilateral UPJ stones with associated mild to moderate hydro-.  Urinalysis is nitrite positive with many bacteria.  Will start IV Rocephin.  Will consult urology.   [PR]  2145 Discussed case with Dr. Lonna CobbStoioff urology who agrees to take the patient to the OR for stent placement.   [PR]    Clinical Course User Index [PR] Willy Eddyobinson, Patrick, MD     As part of my medical decision making, I reviewed the following data within the electronic MEDICAL RECORD NUMBER Nursing notes reviewed and incorporated, Labs reviewed, notes from prior ED visits and [place] Controlled Substance Database   ____________________________________________   FINAL CLINICAL IMPRESSION(S) / ED DIAGNOSES  Final diagnoses:  Sepsis, due to unspecified organism, unspecified whether acute organ dysfunction present (HCC)  Hydronephrosis with ureteropelvic junction (UPJ) obstruction      NEW MEDICATIONS STARTED DURING THIS VISIT:  Current Discharge Medication List       Note:  This document was prepared using Dragon voice recognition software and may include unintentional dictation errors.    Willy Eddyobinson, Patrick,  MD 11/02/18 [phone]

## 2018-11-02 NOTE — Progress Notes (Signed)
Urology Consult Follow Up  Subjective: Feeling much better status post stent placement.  Pain has resolved.  Max temp post stent placement 99.2.  Anti-infectives: Anti-infectives (From admission, onward)   Start     Dose/Rate Route Frequency Ordered Stop   11/02/18 0745  cefTRIAXone (ROCEPHIN) 1 g in sodium chloride 0.9 % 100 mL IVPB     1 g 200 mL/hr over 30 Minutes Intravenous Every 24 hours 11/02/18 0737 11/09/18 0744   11/01/18 2145  cefTRIAXone (ROCEPHIN) 1 g in sodium chloride 0.9 % 100 mL IVPB     1 g 200 mL/hr over 30 Minutes Intravenous  Once 11/01/18 2141 11/01/18 2230      Current Facility-Administered Medications  Medication Dose Route Frequency Provider Last Rate Last Dose  . acetaminophen (TYLENOL) tablet 650 mg  650 mg Oral Q6H PRN Oralia Manis, MD       Or  . acetaminophen (TYLENOL) suppository 650 mg  650 mg Rectal Q6H PRN Oralia Manis, MD      . cefTRIAXone (ROCEPHIN) 1 g in sodium chloride 0.9 % 100 mL IVPB  1 g Intravenous Q24H Salary, Montell D, MD 200 mL/hr at 11/02/18 0845 1 g at 11/02/18 0845  . enoxaparin (LOVENOX) injection 40 mg  40 mg Subcutaneous Q24H Oralia Manis, MD      . HYDROcodone-acetaminophen (NORCO/VICODIN) 5-325 MG per tablet 1-2 tablet  1-2 tablet Oral Q6H PRN Stoioff, Scott C, MD      . insulin aspart (novoLOG) injection 0-15 Units  0-15 Units Subcutaneous TID WC Salary, Montell D, MD      . insulin aspart (novoLOG) injection 0-5 Units  0-5 Units Subcutaneous QHS Salary, Montell D, MD      . morphine 4 MG/ML injection 4 mg  4 mg Intravenous Q3H PRN Oralia Manis, MD   4 mg at 11/01/18 2002  . ondansetron (ZOFRAN) tablet 4 mg  4 mg Oral Q6H PRN Oralia Manis, MD       Or  . ondansetron [facility]) injection 4 mg  4 mg Intravenous Q6H PRN Oralia Manis, MD      . oxybutynin (DITROPAN) tablet 5 mg  5 mg Oral Q8H PRN Stoioff, Scott C, MD      . oxyCODONE (Oxy IR/ROXICODONE) immediate release tablet 5 mg  5 mg Oral Q4H PRN Oralia Manis, MD      .  promethazine (PHENERGAN) injection 12.5 mg  12.5 mg Intravenous Q6H PRN Oralia Manis, MD   12.5 mg at 11/01/18 2002     Objective: Vital signs in last 24 hours: Temp:  [98.1 F (36.7 C)-100.3 F (37.9 C)] 98.4 F (36.9 C) (02/21 0524) Pulse Rate:  [89-119] 119 (02/21 0524) Resp:  [16-31] 20 (02/21 0524) BP: (111-160)/(54-134) 125/66 (02/21 0524) SpO2:  [88 %-99 %] 92 % (02/21 0524) Weight:  [113.4 kg] 113.4 kg (02/20 1444)  Intake/Output from previous day: 02/20 0701 - 02/21 0700 In: 380 [P.O.:380] Out: 350 [Urine:350] Intake/Output this shift: No intake/output data recorded.   Physical Exam: In no acute distress; abdomen soft nontender  Lab Results:  Recent Labs    11/01/18 1451 11/02/18 0602  WBC 18.0* 19.2*  HGB 13.9 11.8*  HCT 42.0 36.0  PLT 366 308   BMET Recent Labs    11/01/18 1451 11/02/18 0602  NA 138 138  K 3.4* 2.9*  CL 103 104  CO2 22 22  GLUCOSE 154* 173*  BUN 16 13  CREATININE 1.19* 1.04*  CALCIUM 9.3 8.2*    Assessment:  s/p Procedure(s): CYSTOSCOPY WITH STENT PLACEMENT-bilateral  Doing well status post bilateral stent placement for obstructing UPJ calculi.  Intraoperative urine culture pending  Recommendation: KUB today. IV antibiotic x24 hours    LOS: 1 day    Riki Altes 11/02/2018

## 2018-11-02 NOTE — Progress Notes (Signed)
[employer] -  at [facility]   PATIENT NAME: Sarah Brady    MR#:  [id number]  DATE OF BIRTH:  1951-05-26  SUBJECTIVE:  Patient feeling better today, med reconciliation completed   REVIEW OF SYSTEMS:  CONSTITUTIONAL: No fever, fatigue or weakness.  EYES: No blurred or double vision.  EARS, NOSE, AND THROAT: No tinnitus or ear pain.  RESPIRATORY: No cough, shortness of breath, wheezing or hemoptysis.  CARDIOVASCULAR: No chest pain, orthopnea, edema.  GASTROINTESTINAL: No nausea, vomiting, diarrhea or abdominal pain.  GENITOURINARY: No dysuria, hematuria.  ENDOCRINE: No polyuria, nocturia,  HEMATOLOGY: No anemia, easy bruising or bleeding SKIN: No rash or lesion. MUSCULOSKELETAL: No joint pain or arthritis.   NEUROLOGIC: No tingling, numbness, weakness.  PSYCHIATRY: No anxiety or depression.   ROS  DRUG ALLERGIES:  Not on File  VITALS:  Blood pressure 125/66, pulse (!) 119, temperature 98.4 F (36.9 C), temperature source Oral, resp. rate 20, height 5\' 5"  (1.651 m), weight 113.4 kg, SpO2 92 %.  PHYSICAL EXAMINATION:  GENERAL:  68 y.o.-year-old patient lying in the bed with no acute distress.  EYES: Pupils equal, round, reactive to light and accommodation. No scleral icterus. Extraocular muscles intact.  HEENT: Head atraumatic, normocephalic. Oropharynx and nasopharynx clear.  NECK:  Supple, no jugular venous distention. No thyroid enlargement, no tenderness.  LUNGS: Normal breath sounds bilaterally, no wheezing, rales,rhonchi or crepitation. No use of accessory muscles of respiration.  CARDIOVASCULAR: S1, S2 normal. No murmurs, rubs, or gallops.  ABDOMEN: Soft, nontender, nondistended. Bowel sounds present. No organomegaly or mass.  EXTREMITIES: No pedal edema, cyanosis, or clubbing.  NEUROLOGIC: Cranial nerves II through XII are intact. Muscle strength 5/5 in all extremities. Sensation intact. Gait not checked.  PSYCHIATRIC: The patient is alert  and oriented x 3.  SKIN: No obvious rash, lesion, or ulcer.   Physical Exam LABORATORY PANEL:   CBC Recent Labs  Lab 11/02/18 0602  WBC 19.2*  HGB 11.8*  HCT 36.0  PLT 308   ------------------------------------------------------------------------------------------------------------------  Chemistries  Recent Labs  Lab 11/01/18 1451 11/02/18 0602  NA 138 138  K 3.4* 2.9*  CL 103 104  CO2 22 22  GLUCOSE 154* 173*  BUN 16 13  CREATININE 1.19* 1.04*  CALCIUM 9.3 8.2*  MG  --  1.4*  AST 25  --   ALT 30  --   ALKPHOS 48  --   BILITOT 1.2  --    ------------------------------------------------------------------------------------------------------------------  Cardiac Enzymes No results for input(s): TROPONINI in the last 168 hours. ------------------------------------------------------------------------------------------------------------------  RADIOLOGY:  Dg Abd 1 View  Result Date: 11/02/2018 CLINICAL DATA:  Nephrolithiasis.  Bilateral ureteral stents. EXAM: ABDOMEN - 1 VIEW COMPARISON:  CT 11/01/2018 FINDINGS: Bilateral double-J ureteral stents noted good anatomic position. Bilateral nephrolithiasis noted. No stones noted at the UPJ is a as noted on prior CT. Gallstone again noted. No bowel distention or free air. Degenerative changes lumbar spine. IMPRESSION: 1. Bilateral ureteral stents noted good anatomic position. Bilateral nephrolithiasis noted. No stones noted over the UPJ is noted on prior CT. 2. Gallstone again noted. Electronically Signed   By: Maisie Fus  Register   On: 11/02/2018 11:40   Ct Abdomen Pelvis W Contrast  Result Date: 11/01/2018 CLINICAL DATA:  Nausea, vomiting, and left upper and lower abdominal/flank pain. EXAM: CT ABDOMEN AND PELVIS WITH CONTRAST TECHNIQUE: Multidetector CT imaging of the abdomen and pelvis was performed using the standard protocol following bolus administration of intravenous contrast. CONTRAST:  OMNIPAQUE IOHEXOL 300 MG/ML  SOLN COMPARISON:  08/16/2009 FINDINGS: Lower chest: Mild atelectasis/scarring in the lung bases. Prior CABG. Coronary artery atherosclerosis. No pleural effusion. Hepatobiliary: Diffusely decreased attenuation of the liver consistent with steatosis. 3 cm stone in the gallbladder without evidence of acute cholecystitis or biliary dilatation. Pancreas: Unremarkable. Spleen: Unremarkable. Adrenals/Urinary Tract: Unremarkable adrenal glands. New bilateral ureteropelvic junction stones measure 10 mm on the right and approximately 16 mm on the left. There is mild-to-moderate right greater than left hydronephrosis. Additional small calculi are present in the lower poles of both kidneys. There is asymmetric left perinephric stranding, and the left kidney appears edematous with diffuse hypoenhancement relative to the right and no contrast excretion on delayed imaging. No ureteral dilatation or ureteral calculi are identified, although the proximal right ureter appears mildly thick-walled near the UPJ stone. The bladder is unremarkable. Stomach/Bowel: There is a small sliding hiatal hernia. The stomach is decompressed. There is no evidence of bowel obstruction or inflammation. The appendix is likely located inferior to the cecum incompletely collapse without evidence of appendicitis. Vascular/Lymphatic: Aortic atherosclerosis without aneurysm. Increased number of subcentimeter aortocaval and left periaortic lymph nodes, most likely reactive. Reproductive: Low density centrally in the uterus which may reflect endometrial thickening or fluid with more bulbous low-density enlargement of the cervix. Three separate rounded intermediate density masses in the pelvis with the largest measuring 12 x 11 cm and likely arising from the right adnexa. The mass contacts and mildly deforms the uterine fundus. Unremarkable left adnexa. Other: No intraperitoneal free fluid. Musculoskeletal: No acute osseous abnormality or suspicious osseous  lesion. IMPRESSION: 1. Bilateral ureteropelvic junction stones with mild-to-moderate bilateral hydronephrosis. Asymmetric left renal edema and lack of left renal excretion. 2. Bilateral nonobstructing renal calculi. 3. Multiple pelvic masses measuring up to 12 cm in size and possibly arising from the right ovary. Possible endometrial thickening or mass. Pelvic ultrasound is recommended to further evaluate these findings. 4. Hepatic steatosis. 5. Cholelithiasis. 6.  Aortic Atherosclerosis (ICD10-I70.0). Electronically Signed   By: Sebastian Ache M.D.   On: 11/01/2018 20:50    ASSESSMENT AND PLAN:  *Acute sepsis  Resolved Secondary to acute pyelonephritis/UTI, bilateral ureteral obstruction due to stones  Continue sepsis protocol, empiric Rocephin, follow-up on cultures  *Acute pyelonephritis/UTI Antibiotics per above and follow-up on cultures  *Acute bilateral hydronephrosis secondary to ureteral stone with obstruction Status post bilateral ureteral stent placement by urology February 20 F 2020 Continue IV fluids for rehydration, avoid nephrotoxic agents, strict I&O monitoring  *Chronic CAD  Stable  Continue home regiment   *Chronic hyperlipidemia, unspecified  Stable   *Chronic hypothyroidism, unspecified Continue Synthroid  *Chronic controlled diabetes mellitus type 2 Start diabetic diet, sliding scale insulin with Accu-Cheks per routine  Discharged home in 1 to 2 days barring any complications  All the records are reviewed and case discussed with Care Management/Social Workerr. Management plans discussed with the patient, family and they are in agreement.  CODE STATUS: full  TOTAL TIME TAKING CARE OF THIS PATIENT: 35 minutes.   POSSIBLE D/C IN 1-2 DAYS, DEPENDING ON CLINICAL CONDITION.  Evelena Asa Salary M.D on 11/02/2018   Between 7am to 6pm - Pager - [phone]  After 6pm go to [email or web address] - Social research officer, government  [facility]  Office   [phone]  CC: Primary care physician; Verner Mould, MD  Note: This dictation was prepared with Dragon dictation along with smaller phrase technology. Any transcriptional errors that result from this process are unintentional.

## 2018-11-02 NOTE — Transfer of Care (Signed)
Immediate Anesthesia Transfer of Care Note  Patient: Sarah Brady  Procedure(s) Performed: CYSTOSCOPY WITH STENT PLACEMENT (Bilateral )  Patient Location: PACU  Anesthesia Type:General  Level of Consciousness: awake, alert  and oriented  Airway & Oxygen Therapy: Patient connected to face mask oxygen  Post-op Assessment: Post -op Vital signs reviewed and stable  Post vital signs: stable  Last Vitals:  Vitals Value Taken Time  BP 112/60 11/01/2018 11:59 PM  Temp 36.7 C 11/01/2018 11:59 PM  Pulse 100 11/01/2018 11:59 PM  Resp 25 11/01/2018 11:59 PM  SpO2 99 % 11/01/2018 11:59 PM  Vitals shown include unvalidated device data.  Last Pain:  Vitals:   11/01/18 2145  TempSrc: Oral  PainSc:          Complications: No apparent anesthesia complications

## 2018-11-03 ENCOUNTER — Encounter: Payer: Self-pay | Admitting: *Deleted

## 2018-11-03 LAB — CBC
HCT: 36.4 % (ref 36.0–46.0)
Hemoglobin: 11.8 g/dL — ABNORMAL LOW (ref 12.0–15.0)
MCH: 30.6 pg (ref 26.0–34.0)
MCHC: 32.4 g/dL (ref 30.0–36.0)
MCV: 94.3 fL (ref 80.0–100.0)
Platelets: 275 10*3/uL (ref 150–400)
RBC: 3.86 MIL/uL — ABNORMAL LOW (ref 3.87–5.11)
RDW: 13.9 % (ref 11.5–15.5)
WBC: 11.8 10*3/uL — ABNORMAL HIGH (ref 4.0–10.5)
nRBC: 0 % (ref 0.0–0.2)

## 2018-11-03 LAB — BASIC METABOLIC PANEL
Anion gap: 6 (ref 5–15)
BUN: 8 mg/dL (ref 8–23)
CO2: 24 mmol/L (ref 22–32)
Calcium: 7.7 mg/dL — ABNORMAL LOW (ref 8.9–10.3)
Chloride: 105 mmol/L (ref 98–111)
Creatinine, Ser: 0.74 mg/dL (ref 0.44–1.00)
GFR calc Af Amer: >60 mL/min (ref >60)
GFR calc non Af Amer: >60 mL/min (ref >60)
Glucose, Bld: 211 mg/dL — ABNORMAL HIGH (ref 70–99)
Potassium: 3.2 mmol/L — ABNORMAL LOW (ref 3.5–5.1)
Sodium: 135 mmol/L (ref 135–145)

## 2018-11-03 LAB — MAGNESIUM: Magnesium: 2 mg/dL (ref 1.7–2.4)

## 2018-11-03 LAB — GLUCOSE, CAPILLARY
Glucose-Capillary: 121 mg/dL — ABNORMAL HIGH (ref 70–99)
Glucose-Capillary: 115 mg/dL — ABNORMAL HIGH (ref 70–99)
Glucose-Capillary: 122 mg/dL — ABNORMAL HIGH (ref 70–99)
Glucose-Capillary: 131 mg/dL — ABNORMAL HIGH (ref 70–99)

## 2018-11-03 LAB — HIV ANTIBODY (ROUTINE TESTING W REFLEX): HIV Screen 4th Generation wRfx: NONREACTIVE

## 2018-11-03 MED ORDER — METOPROLOL TARTRATE 25 MG PO TABS
25.0000 mg | ORAL_TABLET | Freq: Once | ORAL | Status: AC
  Administered 2018-11-03: 25 mg via ORAL
  Filled 2018-11-03: qty 1

## 2018-11-03 MED ORDER — POTASSIUM CHLORIDE CRYS ER 20 MEQ PO TBCR
40.0000 meq | EXTENDED_RELEASE_TABLET | Freq: Once | ORAL | Status: AC
  Administered 2018-11-03: 40 meq via ORAL
  Filled 2018-11-03: qty 2

## 2018-11-03 MED ORDER — METOPROLOL TARTRATE 50 MG PO TABS
50.0000 mg | ORAL_TABLET | Freq: Two times a day (BID) | ORAL | Status: DC
  Administered 2018-11-03 – 2018-11-04 (×2): 50 mg via ORAL
  Filled 2018-11-03 (×2): qty 1

## 2018-11-03 MED ORDER — CEFTRIAXONE SODIUM 1 G IJ SOLR
1.0000 g | INTRAMUSCULAR | Status: DC
  Administered 2018-11-03 – 2018-11-04 (×2): 1 g via INTRAMUSCULAR
  Filled 2018-11-03 (×4): qty 10

## 2018-11-03 MED ORDER — DILTIAZEM HCL 60 MG PO TABS
60.0000 mg | ORAL_TABLET | Freq: Three times a day (TID) | ORAL | Status: DC
  Administered 2018-11-03 – 2018-11-04 (×3): 60 mg via ORAL
  Filled 2018-11-03 (×5): qty 1

## 2018-11-03 MED ORDER — DIGOXIN 250 MCG PO TABS
0.2500 mg | ORAL_TABLET | Freq: Every day | ORAL | Status: DC
  Administered 2018-11-03 – 2018-11-04 (×2): 0.25 mg via ORAL
  Filled 2018-11-03 (×2): qty 1

## 2018-11-03 MED ORDER — DIGOXIN 0.25 MG/ML IJ SOLN
0.2500 mg | Freq: Once | INTRAMUSCULAR | Status: DC
  Filled 2018-11-03: qty 2

## 2018-11-03 NOTE — Progress Notes (Signed)
Patients iv site was discontinued due to leaking.  IV team attempted x2 without success. Pt was assessed with no successful attempts. Per iv nurse,  patient is not a candidate for midline.  Recommends a picc line if pt. continues to need iv fluids or iv medication.

## 2018-11-03 NOTE — Progress Notes (Signed)
[facility] - [place] [facility]   PATIENT NAME: Sarah Brady    MR#:  [id number]  DATE OF BIRTH:  February 23, 1951  SUBJECTIVE:  Patient states that she is not feeling well IV came out Denies any chest pain or palpitation  REVIEW OF SYSTEMS:  CONSTITUTIONAL: No fever, fatigue or weakness.  EYES: No blurred or double vision.  EARS, NOSE, AND THROAT: No tinnitus or ear pain.  RESPIRATORY: No cough, shortness of breath, wheezing or hemoptysis.  CARDIOVASCULAR: No chest pain, orthopnea, edema.  GASTROINTESTINAL: No nausea, vomiting, diarrhea or abdominal pain.  GENITOURINARY: No dysuria, hematuria.  ENDOCRINE: No polyuria, nocturia,  HEMATOLOGY: No anemia, easy bruising or bleeding SKIN: No rash or lesion. MUSCULOSKELETAL: No joint pain or arthritis.   NEUROLOGIC: No tingling, numbness, weakness.  PSYCHIATRY: No anxiety or depression.   ROS  DRUG ALLERGIES:  Not on File  VITALS:  Blood pressure 105/67, pulse 76, temperature 97.9 F (36.6 C), temperature source Oral, resp. rate 16, height  (1.651 m), weight 113.4 kg, SpO2 94 %.  PHYSICAL EXAMINATION:  GENERAL:  68 y.o.-year-old patient lying in the bed with no acute distress.  EYES: Pupils equal, round, reactive to light and accommodation. No scleral icterus. Extraocular muscles intact.  HEENT: Head atraumatic, normocephalic. Oropharynx and nasopharynx clear.  NECK:  Supple, no jugular venous distention. No thyroid enlargement, no tenderness.  LUNGS: Normal breath sounds bilaterally, no wheezing, rales,rhonchi or crepitation. No use of accessory muscles of respiration.  CARDIOVASCULAR: Irregularly irregular tachycardic l. No murmurs, rubs, or gallops.  ABDOMEN: Soft, nontender, nondistended. Bowel sounds present. No organomegaly or mass.  EXTREMITIES: No pedal edema, cyanosis, or clubbing.  NEUROLOGIC: Cranial nerves II through XII are intact. Muscle strength 5/5 in all extremities. Sensation intact. Gait  not checked.  PSYCHIATRIC: The patient is alert and oriented x 3.  SKIN: No obvious rash, lesion, or ulcer.   Physical Exam LABORATORY PANEL:   CBC Recent Labs  Lab 11/03/18 0928  WBC 11.8*  HGB 11.8*  HCT 36.4  PLT 275   ------------------------------------------------------------------------------------------------------------------  Chemistries  Recent Labs  Lab 11/01/18 1451  11/03/18 0444 11/03/18 0928  NA 138   < >  --  135  K 3.4*   < >  --  3.2*  CL 103   < >  --  105  CO2 22   < >  --  24  GLUCOSE 154*   < >  --  211*  BUN 16   < >  --  8  CREATININE 1.19*   < >  --  0.74  CALCIUM 9.3   < >  --  7.7*  MG  --    < > 2.0  --   AST 25  --   --   --   ALT 30  --   --   --   ALKPHOS 48  --   --   --   BILITOT 1.2  --   --   --    < > = values in this interval not displayed.   ------------------------------------------------------------------------------------------------------------------  Cardiac Enzymes No results for input(s): TROPONINI in the last 168 hours. ------------------------------------------------------------------------------------------------------------------  RADIOLOGY:  Dg Abd 1 View  Result Date: 11/02/2018 CLINICAL DATA:  Nephrolithiasis.  Bilateral ureteral stents. EXAM: ABDOMEN - 1 VIEW COMPARISON:  CT 11/01/2018 FINDINGS: Bilateral double-J ureteral stents noted good anatomic position. Bilateral nephrolithiasis noted. No stones noted at the UPJ is a as noted on prior CT. Gallstone  again noted. No bowel distention or free air. Degenerative changes lumbar spine. IMPRESSION: 1. Bilateral ureteral stents noted good anatomic position. Bilateral nephrolithiasis noted. No stones noted over the UPJ is noted on prior CT. 2. Gallstone again noted. Electronically Signed   By: Maisie Fus  Register   On: 11/02/2018 11:40   Ct Abdomen Pelvis W Contrast  Result Date: 11/01/2018 CLINICAL DATA:  Nausea, vomiting, and left upper and lower abdominal/flank pain.  EXAM: CT ABDOMEN AND PELVIS WITH CONTRAST TECHNIQUE: Multidetector CT imaging of the abdomen and pelvis was performed using the standard protocol following bolus administration of intravenous contrast. CONTRAST:  OMNIPAQUE IOHEXOL 300 MG/ML  SOLN COMPARISON:  08/16/2009 FINDINGS: Lower chest: Mild atelectasis/scarring in the lung bases. Prior CABG. Coronary artery atherosclerosis. No pleural effusion. Hepatobiliary: Diffusely decreased attenuation of the liver consistent with steatosis. 3 cm stone in the gallbladder without evidence of acute cholecystitis or biliary dilatation. Pancreas: Unremarkable. Spleen: Unremarkable. Adrenals/Urinary Tract: Unremarkable adrenal glands. New bilateral ureteropelvic junction stones measure 10 mm on the right and approximately 16 mm on the left. There is mild-to-moderate right greater than left hydronephrosis. Additional small calculi are present in the lower poles of both kidneys. There is asymmetric left perinephric stranding, and the left kidney appears edematous with diffuse hypoenhancement relative to the right and no contrast excretion on delayed imaging. No ureteral dilatation or ureteral calculi are identified, although the proximal right ureter appears mildly thick-walled near the UPJ stone. The bladder is unremarkable. Stomach/Bowel: There is a small sliding hiatal hernia. The stomach is decompressed. There is no evidence of bowel obstruction or inflammation. The appendix is likely located inferior to the cecum incompletely collapse without evidence of appendicitis. Vascular/Lymphatic: Aortic atherosclerosis without aneurysm. Increased number of subcentimeter aortocaval and left periaortic lymph nodes, most likely reactive. Reproductive: Low density centrally in the uterus which may reflect endometrial thickening or fluid with more bulbous low-density enlargement of the cervix. Three separate rounded intermediate density masses in the pelvis with the largest  measuring 12 x 11 cm and likely arising from the right adnexa. The mass contacts and mildly deforms the uterine fundus. Unremarkable left adnexa. Other: No intraperitoneal free fluid. Musculoskeletal: No acute osseous abnormality or suspicious osseous lesion. IMPRESSION: 1. Bilateral ureteropelvic junction stones with mild-to-moderate bilateral hydronephrosis. Asymmetric left renal edema and lack of left renal excretion. 2. Bilateral nonobstructing renal calculi. 3. Multiple pelvic masses measuring up to 12 cm in size and possibly arising from the right ovary. Possible endometrial thickening or mass. Pelvic ultrasound is recommended to further evaluate these findings. 4. Hepatic steatosis. 5. Cholelithiasis. 6.  Aortic Atherosclerosis (ICD10-I70.0). Electronically Signed   By: Sebastian Ache M.D.   On: 11/01/2018 20:50    ASSESSMENT AND PLAN:  *Acute sepsis  Resolved Secondary to acute pyelonephritis/UTI, bilateral ureteral obstruction due to stones  Continue Rocephin follow-up cultures  *A. fib with RVR we will give patient a dose of digoxin, increase metoprolol and place her on oral Cardizem  *Acute pyelonephritis/UTI Continue ceftriaxone now switched to IM due to loss of IV Follow urine culture  *Acute bilateral hydronephrosis secondary to ureteral stone with obstruction Status post bilateral ureteral stent placement by urology February 20 F 2020   *Chronic CAD  Stable  Continue home regiment   *Chronic hyperlipidemia, unspecified  Stable   *Chronic hypothyroidism, unspecified Continue Synthroid  *Chronic controlled diabetes mellitus type 2 Start diabetic diet, sliding scale insulin with Accu-Cheks per routine   Discharge tomorrow  All the records are reviewed and  case discussed with Care Management/Social Workerr. Management plans discussed with the patient, family and they are in agreement.  CODE STATUS: full  TOTAL TIME TAKING CARE OF THIS PATIENT: 35 minutes.   POSSIBLE  D/C IN 1-2 DAYS, DEPENDING ON CLINICAL CONDITION.  Auburn Bilberry M.D on 11/03/2018   Between 7am to 6pm - Pager - [phone]  After 6pm go to [email or web address] - Social research officer, government  [facility]  Office  [phone]  CC: Primary care physician; Verner Mould, MD  Note: This dictation was prepared with Dragon dictation along with smaller phrase technology. Any transcriptional errors that result from this process are unintentional.

## 2018-11-03 NOTE — Progress Notes (Signed)
Patient placed on tele. Running A-flutter. Notified Dr Allena Katz. New orders already placed. Patient given additional 25mg  of Metoprolol,  Digoxin .25mg , and Cardizem 60mg .

## 2018-11-04 LAB — BASIC METABOLIC PANEL
Anion gap: 8 (ref 5–15)
BUN: 10 mg/dL (ref 8–23)
CO2: 25 mmol/L (ref 22–32)
Calcium: 8.1 mg/dL — ABNORMAL LOW (ref 8.9–10.3)
Chloride: 107 mmol/L (ref 98–111)
Creatinine, Ser: 0.77 mg/dL (ref 0.44–1.00)
GFR calc Af Amer: >60 mL/min (ref >60)
GFR calc non Af Amer: >60 mL/min (ref >60)
Glucose, Bld: 145 mg/dL — ABNORMAL HIGH (ref 70–99)
Potassium: 3.6 mmol/L (ref 3.5–5.1)
Sodium: 140 mmol/L (ref 135–145)

## 2018-11-04 LAB — URINE CULTURE
Culture: 50000 — AB
Culture: >=100000 — AB

## 2018-11-04 LAB — GLUCOSE, CAPILLARY: Glucose-Capillary: 146 mg/dL — ABNORMAL HIGH (ref 70–99)

## 2018-11-04 MED ORDER — ASPIRIN 81 MG PO CHEW: 81.0000 mg | CHEWABLE_TABLET | Freq: Every day | ORAL | 0 refills | Status: DC

## 2018-11-04 MED ORDER — OXYBUTYNIN CHLORIDE 5 MG PO TABS: 5.0000 mg | ORAL_TABLET | Freq: Three times a day (TID) | ORAL | 0 refills | Status: DC | PRN

## 2018-11-04 MED ORDER — METOPROLOL TARTRATE 50 MG PO TABS: 50.0000 mg | ORAL_TABLET | Freq: Two times a day (BID) | ORAL | 0 refills | Status: AC

## 2018-11-04 MED ORDER — CEFUROXIME AXETIL 500 MG PO TABS: 500.0000 mg | ORAL_TABLET | Freq: Two times a day (BID) | ORAL | 0 refills | Status: AC

## 2018-11-04 NOTE — Discharge Summary (Signed)
[facility] - [place] at [facility], 68 y.o., DOB 10-19-1950, MRN [id number]. Admission date: 11/01/2018 Discharge Date 11/04/2018 Primary MD Verner Mould, MD Admitting Physician Oralia Manis, MD  Admission Diagnosis  Hydronephrosis with ureteropelvic junction (UPJ) obstruction [Q62.11] Sepsis, due to unspecified organism, unspecified whether acute organ dysfunction present [facility]) [A41.9]  Discharge Diagnosis   Active Problems:   Sepsis (HCC) due to UTI   Kidney stone   UTI (urinary tract infection)/acute pyelonephritis   A. fib with RVR Acute bilateral hydronephrosis secondary to ureteral stone with obstruction status post bilateral ureteral stent placement Coronary artery disease Hyperlipidemia Hypothyroidism     Hospital Course  Stevanie Britts  is a 68 y.o. female who presents with chief complaint as above.  Patient presents the ED with bilateral abdominal and back pain.  She is found here to have bilateral obstructing kidney stones and UTI.  Patient was admitted and started on antibiotics.  She was seen by urology and underwent stent placement.  Patient also developed A. fib with RVR she has a history of atrial fibrillation in the past and is only on aspirin.  She needs to follow-up with cardiology decide if full dose anticoagulation is needed.  Urine culture showed E. coli and Proteus which are both sensitive.  Patient will be discharged on oral antibiotics.            Consults  urology  Significant Tests:  See full reports for all details     Dg Abd 1 View  Result Date: 11/02/2018 CLINICAL DATA:  Nephrolithiasis.  Bilateral ureteral stents. EXAM: ABDOMEN - 1 VIEW COMPARISON:  CT 11/01/2018 FINDINGS: Bilateral double-J ureteral stents noted good anatomic position. Bilateral nephrolithiasis noted. No stones noted at the UPJ is a as noted on prior CT. Gallstone again noted. No bowel distention or free air. Degenerative changes lumbar spine.  IMPRESSION: 1. Bilateral ureteral stents noted good anatomic position. Bilateral nephrolithiasis noted. No stones noted over the UPJ is noted on prior CT. 2. Gallstone again noted. Electronically Signed   By: Maisie Fus  Register   On: 11/02/2018 11:40   Ct Abdomen Pelvis W Contrast  Result Date: 11/01/2018 CLINICAL DATA:  Nausea, vomiting, and left upper and lower abdominal/flank pain. EXAM: CT ABDOMEN AND PELVIS WITH CONTRAST TECHNIQUE: Multidetector CT imaging of the abdomen and pelvis was performed using the standard protocol following bolus administration of intravenous contrast. CONTRAST:  OMNIPAQUE IOHEXOL 300 MG/ML  SOLN COMPARISON:  08/16/2009 FINDINGS: Lower chest: Mild atelectasis/scarring in the lung bases. Prior CABG. Coronary artery atherosclerosis. No pleural effusion. Hepatobiliary: Diffusely decreased attenuation of the liver consistent with steatosis. 3 cm stone in the gallbladder without evidence of acute cholecystitis or biliary dilatation. Pancreas: Unremarkable. Spleen: Unremarkable. Adrenals/Urinary Tract: Unremarkable adrenal glands. New bilateral ureteropelvic junction stones measure 10 mm on the right and approximately 16 mm on the left. There is mild-to-moderate right greater than left hydronephrosis. Additional small calculi are present in the lower poles of both kidneys. There is asymmetric left perinephric stranding, and the left kidney appears edematous with diffuse hypoenhancement relative to the right and no contrast excretion on delayed imaging. No ureteral dilatation or ureteral calculi are identified, although the proximal right ureter appears mildly thick-walled near the UPJ stone. The bladder is unremarkable. Stomach/Bowel: There is a small sliding hiatal hernia. The stomach is decompressed. There is no evidence of bowel obstruction or inflammation. The appendix is likely located inferior to the cecum incompletely collapse without evidence of appendicitis.  Vascular/Lymphatic: Aortic atherosclerosis without aneurysm. Increased number of subcentimeter aortocaval and left periaortic lymph nodes, most likely reactive. Reproductive: Low density centrally in the uterus which may reflect endometrial thickening or fluid with more bulbous low-density enlargement of the cervix. Three separate rounded intermediate density masses in the pelvis with the largest measuring 12 x 11 cm and likely arising from the right adnexa. The mass contacts and mildly deforms the uterine fundus. Unremarkable left adnexa. Other: No intraperitoneal free fluid. Musculoskeletal: No acute osseous abnormality or suspicious osseous lesion. IMPRESSION: 1. Bilateral ureteropelvic junction stones with mild-to-moderate bilateral hydronephrosis. Asymmetric left renal edema and lack of left renal excretion. 2. Bilateral nonobstructing renal calculi. 3. Multiple pelvic masses measuring up to 12 cm in size and possibly arising from the right ovary. Possible endometrial thickening or mass. Pelvic ultrasound is recommended to further evaluate these findings. 4. Hepatic steatosis. 5. Cholelithiasis. 6.  Aortic Atherosclerosis (ICD10-I70.0). Electronically Signed   By: Sebastian Ache M.D.   On: 11/01/2018 20:50       Today   Subjective:   Sarah Brady patient doing well denies any complaints Objective:   Blood pressure 115/64, pulse 63, temperature 97.6 F (36.4 C), temperature source Oral, resp. rate 18, height  (1.651 m), weight 113.4 kg, SpO2 96 %.  .  Intake/Output Summary (Last 24 hours) at 11/04/2018 1311 Last data filed at 11/03/2018 2145 Gross per 24 hour  Intake 600 ml  Output -  Net 600 ml    Exam VITAL SIGNS: Blood pressure 115/64, pulse 63, temperature 97.6 F (36.4 C), temperature source Oral, resp. rate 18, height  (1.651 m), weight 113.4 kg, SpO2 96 %.  GENERAL:  68 y.o.-year-old patient lying in the bed with no acute distress.  EYES: Pupils equal, round, reactive  to light and accommodation. No scleral icterus. Extraocular muscles intact.  HEENT: Head atraumatic, normocephalic. Oropharynx and nasopharynx clear.  NECK:  Supple, no jugular venous distention. No thyroid enlargement, no tenderness.  LUNGS: Normal breath sounds bilaterally, no wheezing, rales,rhonchi or crepitation. No use of accessory muscles of respiration.  CARDIOVASCULAR: S1, S2 normal. No murmurs, rubs, or gallops.  ABDOMEN: Soft, nontender, nondistended. Bowel sounds present. No organomegaly or mass.  EXTREMITIES: No pedal edema, cyanosis, or clubbing.  NEUROLOGIC: Cranial nerves II through XII are intact. Muscle strength 5/5 in all extremities. Sensation intact. Gait not checked.  PSYCHIATRIC: The patient is alert and oriented x 3.  SKIN: No obvious rash, lesion, or ulcer.   Data Review     CBC w Diff:  Lab Results  Component Value Date   WBC 11.8 (H) 11/03/2018   HGB 11.8 (L) 11/03/2018   HCT 36.4 11/03/2018   PLT 275 11/03/2018   CMP:  Lab Results  Component Value Date   NA 140 11/04/2018   K 3.6 11/04/2018   CL 107 11/04/2018   CO2 25 11/04/2018   BUN 10 11/04/2018   CREATININE 0.77 11/04/2018   PROT 7.4 11/01/2018   ALBUMIN 4.0 11/01/2018   BILITOT 1.2 11/01/2018   ALKPHOS 48 11/01/2018   AST 25 11/01/2018   ALT 30 11/01/2018  .  Micro Results Recent Results (from the past 240 hour(s))  Urine Culture     Status: Abnormal   Collection Time: 11/01/18 11:36 PM  Result Value Ref Range Status   Specimen Description CYSTOSCOPY  Final   Special Requests   Final    NONE Performed at [facility], [address] [place], [place] [address]  Culture >=100,000 COLONIES/mL PROTEUS MIRABILIS (A)  Final   Report Status 11/04/2018 FINAL  Final   Organism ID, Bacteria PROTEUS MIRABILIS (A)  Final      Susceptibility   Proteus mirabilis - MIC*    AMPICILLIN <=2 SENSITIVE Sensitive     CEFAZOLIN 8 SENSITIVE Sensitive     CEFEPIME <=1 SENSITIVE Sensitive      CEFTAZIDIME <=1 SENSITIVE Sensitive     CEFTRIAXONE <=1 SENSITIVE Sensitive     CIPROFLOXACIN <=0.25 SENSITIVE Sensitive     GENTAMICIN <=1 SENSITIVE Sensitive     IMIPENEM 1 SENSITIVE Sensitive     TRIMETH/SULFA <=20 SENSITIVE Sensitive     AMPICILLIN/SULBACTAM <=2 SENSITIVE Sensitive     PIP/TAZO <=4 SENSITIVE Sensitive     * >=100,000 COLONIES/mL PROTEUS MIRABILIS  Urine Culture     Status: Abnormal   Collection Time: 11/01/18 11:40 PM  Result Value Ref Range Status   Specimen Description CYSTOSCOPY  Final   Special Requests   Final    NONE Performed at [facility], [address] [place], [place] [address]    Culture 50,000 COLONIES/mL ESCHERICHIA COLI (A)  Final   Report Status 11/04/2018 FINAL  Final   Organism ID, Bacteria ESCHERICHIA COLI (A)  Final      Susceptibility   Escherichia coli - MIC*    AMPICILLIN 8 SENSITIVE Sensitive     CEFAZOLIN <=4 SENSITIVE Sensitive     CEFEPIME <=1 SENSITIVE Sensitive     CEFTAZIDIME <=1 SENSITIVE Sensitive     CEFTRIAXONE <=1 SENSITIVE Sensitive     CIPROFLOXACIN <=0.25 SENSITIVE Sensitive     GENTAMICIN <=1 SENSITIVE Sensitive     IMIPENEM <=0.25 SENSITIVE Sensitive     TRIMETH/SULFA <=20 SENSITIVE Sensitive     AMPICILLIN/SULBACTAM <=2 SENSITIVE Sensitive     PIP/TAZO <=4 SENSITIVE Sensitive     Extended ESBL NEGATIVE Sensitive     * 50,000 COLONIES/mL ESCHERICHIA COLI        Code Status Orders  (From admission, onward)         Start     Ordered   11/02/18 0041  Full code  Continuous     11/02/18 0040        Code Status History    This patient has a current code status but no historical code status.          Follow-up Information    Verner Mould, MD Follow up in 6 day(s).   Specialty:  Family Medicine Contact information: [address] [place] [address] [phone]        Riki Altes, MD Follow up in 2 week(s).   Specialty:  Urology Why:  hosp f/u renal  stent Contact information: [address] [place] [address] [phone]           Discharge Medications   Allergies as of 11/04/2018   Not on File     Medication List    TAKE these medications   aspirin 81 MG chewable tablet Chew 1 tablet (81 mg total) by mouth daily with breakfast.   atorvastatin 80 MG tablet Commonly known as:  LIPITOR Take 80 mg by mouth daily.   buPROPion 300 MG 24 hr tablet Commonly known as:  WELLBUTRIN XL Take 300 mg by mouth daily.   buPROPion 150 MG 24 hr tablet Commonly known as:  WELLBUTRIN XL Take 150 mg by mouth daily.   cefUROXime 500 MG tablet Commonly known as:  CEFTIN  Take 1 tablet (500 mg total) by mouth 2 (two) times daily for 5 days.   clobetasol ointment 0.05 % Commonly known as:  TEMOVATE Apply 0.05 application topically as needed.   furosemide 40 MG tablet Commonly known as:  LASIX Take 40 mg by mouth 2 (two) times daily.   levothyroxine 125 MCG tablet Commonly known as:  SYNTHROID, LEVOTHROID Take 125 mcg by mouth daily.   metFORMIN 500 MG 24 hr tablet Commonly known as:  GLUCOPHAGE-XR Take 1,000 mg by mouth daily. With largest meal of the day.   metoprolol tartrate 50 MG tablet Commonly known as:  LOPRESSOR Take 1 tablet (50 mg total) by mouth 2 (two) times daily. What changed:    medication strength  how much to take   oxybutynin 5 MG tablet Commonly known as:  DITROPAN Take 1 tablet (5 mg total) by mouth every 8 (eight) hours as needed for bladder spasms (Urinary frequency, urgency, bladder spasms).   potassium chloride 10 MEQ tablet Commonly known as:  K-DUR,KLOR-CON Take 10 mEq by mouth 2 (two) times daily.   Vitamin D (Ergocalciferol) 1.25 MG (50000 UT) Caps capsule Commonly known as:  DRISDOL Take 50,000 Units by mouth 2 (two) times a week.          Total Time in preparing paper work, data evaluation and todays exam - 35 minutes  Auburn BilberryShreyang Patel M.D on 11/04/2018 at  1:11 PM [facility]   Office  [phone]

## 2018-11-04 NOTE — Progress Notes (Signed)
[facility] - [place] at [facility] was admitted to the Hospital on 11/01/2018 and Discharged  11/04/2018 and should be excused from work/school   for 7  days starting 11/08/2018 , may return to work/school without any restrictions.  Call Auburn Bilberry MD with questions.  Auburn Bilberry M.D on 11/04/2018,at 11:23 AM  [facility] - [place] [facility]  [phone]

## 2018-11-06 NOTE — Anesthesia Postprocedure Evaluation (Signed)
Anesthesia Post Note  Patient: Sarah Brady  Procedure(s) Performed: CYSTOSCOPY WITH STENT PLACEMENT (Bilateral )  Patient location during evaluation: PACU Anesthesia Type: General Level of consciousness: awake and alert and oriented Pain management: pain level controlled Vital Signs Assessment: post-procedure vital signs reviewed and stable Respiratory status: spontaneous breathing Cardiovascular status: blood pressure returned to baseline Anesthetic complications: no     Last Vitals:  Vitals:   11/04/18 0556 11/04/18 0808  BP: 114/66 115/64  Pulse: 72 63  Resp: 16 18  Temp: 37.1 C 36.4 C  SpO2: 95% 96%    Last Pain:  Vitals:   11/04/18 1100  TempSrc:   PainSc: 0-No pain                 CARROLL,PAUL

## 2018-11-19 ENCOUNTER — Encounter: Payer: Self-pay | Admitting: Urology

## 2018-11-19 ENCOUNTER — Telehealth: Payer: Self-pay | Admitting: Family Medicine

## 2018-11-19 NOTE — Telephone Encounter (Signed)
Hi Dr. Lonna Cobb,  I will not be able to keep my appointment on 03/13.  I am having surgery on 03/11 for the masses located in my ovaries.    Thank you, [facility]  I cancelled the appointment. FYI

## 2018-11-20 NOTE — Telephone Encounter (Signed)
She still has bilateral ureteral stents and UPJ stones.  She will need follow-up to discuss treatment

## 2018-11-20 NOTE — Telephone Encounter (Signed)
Spoke to patient and she states she will call after she is done with the surgery and get scheduled to be seen about the stents.

## 2018-11-21 HISTORY — PX: ABDOMINAL HYSTERECTOMY: SHX81

## 2018-11-23 ENCOUNTER — Ambulatory Visit: Payer: Self-pay | Admitting: Urology

## 2019-01-02 ENCOUNTER — Telehealth: Payer: Self-pay | Admitting: Urology

## 2019-01-02 NOTE — Telephone Encounter (Signed)
Pt needs to schedule a f/u for kidney stones and wants to know if she can do virtual or come in to office.  She was supposed to come 3/13 and had to cancel.

## 2019-01-07 NOTE — Telephone Encounter (Signed)
Can do a virtual visit. 

## 2019-01-09 ENCOUNTER — Telehealth (INDEPENDENT_AMBULATORY_CARE_PROVIDER_SITE_OTHER): Payer: Medicare Other | Admitting: Urology

## 2019-01-09 ENCOUNTER — Other Ambulatory Visit: Payer: Self-pay

## 2019-01-09 ENCOUNTER — Encounter: Payer: Self-pay | Admitting: *Deleted

## 2019-01-09 DIAGNOSIS — N200 Calculus of kidney: Secondary | ICD-10-CM

## 2019-01-09 NOTE — Progress Notes (Signed)
Adivsed patient as instructed. Patient aware to get KUB at [facility] outpatient imaging. Provided address and # via MyChart as requested.

## 2019-01-09 NOTE — Progress Notes (Signed)
Virtual Visit via Video Note  I connected with Sarah Brady on 01/09/19 at 11:00 AM EDT by a video enabled telemedicine application and verified that I am speaking with the correct person using two identifiers.   I discussed the limitations of evaluation and management by telemedicine and the availability of in person appointments. The patient expressed understanding and agreed to proceed.  History of Present Illness: Follow-up nephrolithiasis.  Telehealth visit performed secondary to COVID-19 pandemic.  68 year old female who was initially seen on 11/01/2018 with left lower quadrant abdominal pain and fever.  CT showed a 10 mm right UPJ calculus and a 16 mm left UPJ calculus with mild to moderate hydronephrosis.  She had grossly infected urine and leukocytosis.  She underwent urgent bilateral ureteral stent placement.  Intraoperative findings remarkable for left pyonephrosis and cloudy urine from the right renal pelvis.  Interestingly urine culture from the left renal pelvis grew 100,000 colonies of Proteus and of the right renal pelvis grew 50,000 colonies of E. coli.  She had significant clinical improvement after stent placement.  She was to be scheduled for definitive stone treatment however she was incidentally noted to have 3 intermediate density pelvic masses the largest measuring 12 x 11 cm felt to arise from the right ovary.  MRI showed a large multiloculated pelvic mass measuring 14.7 x 14.2 x 11.2 cm with multiple complex components.  She underwent laparoscopic TAH/BSO/omental biopsy/pelvic node sampling at [facility] on 11/21/2018.  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  After convalescence from her surgery she presents for discussion of definitive stone management.  She is having mild stent symptoms.  Denies fever or chills.   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  I have initially recommended a KUB to evaluate for stent encrustation and to see if her calculi are visible on plain radiography.  Potential treatment options were discussed including shockwave lithotripsy and ureteroscopic removal.  She will be notified with the KUB results and further recommendations.  Follow Up Instructions: KUB as above   I discussed the assessment and treatment plan with the patient. The patient was provided an opportunity to ask questions and all were answered. The patient agreed with the plan and demonstrated an understanding of the instructions.   The patient was advised to call back or seek an in-person evaluation if the symptoms worsen or if the condition fails to improve as anticipated.  I provided 10 minutes of non-face-to-face time during this encounter.   Riki Altes, MD

## 2019-01-10 ENCOUNTER — Ambulatory Visit
Admission: RE | Admit: 2019-01-10 | Discharge: 2019-01-10 | Disposition: A | Discharge status: Home / Self Care | Payer: Medicare Other | Source: Ambulatory Visit | Attending: Urology | Admitting: Urology

## 2019-01-10 ENCOUNTER — Ambulatory Visit
Admission: RE | Admit: 2019-01-10 | Discharge: 2019-01-10 | Disposition: A | Discharge status: Home / Self Care | Payer: Medicare Other | Attending: Urology | Admitting: Urology

## 2019-01-10 ENCOUNTER — Other Ambulatory Visit: Payer: Self-pay

## 2019-01-10 DIAGNOSIS — N200 Calculus of kidney: Secondary | ICD-10-CM | POA: Insufficient documentation

## 2019-01-18 ENCOUNTER — Encounter: Payer: Self-pay | Admitting: Urology

## 2019-01-23 ENCOUNTER — Telehealth: Payer: Self-pay | Admitting: Urology

## 2019-01-23 NOTE — Telephone Encounter (Signed)
I contacted patient regarding her KUB results.  Her stones are easily visualized on KUB.  I sent her a message earlier this week regarding definitive stone treatment either bilateral ureteroscopy versus staged shockwave lithotripsy.  The pros and cons of each treatment were discussed.  She has had 2 recent anesthetic procedures including stent placement and excision of multiple pelvic masses.  She would prefer not to undergo general anesthesia and has elected shockwave lithotripsy.  She does understand that only one side can be treated at a time.  She would like to start treatment next week   Amy/Leah please schedule left shockwave lithotripsy next Thursday.  I will fill out the order sheet when I am in next week

## 2019-01-28 ENCOUNTER — Encounter: Payer: Self-pay | Admitting: Radiology

## 2019-01-28 ENCOUNTER — Other Ambulatory Visit: Payer: Self-pay | Admitting: Radiology

## 2019-01-28 DIAGNOSIS — N200 Calculus of kidney: Secondary | ICD-10-CM

## 2019-01-28 NOTE — Telephone Encounter (Signed)
Notified patient of extracorporeal shockwave lithotripsy scheduled on 01/31/2019 at 6:00. Patient should arrive at that time to Same Day Surgery. Pre-op & discharge instructions for lithotripsy were provided to patient.    Advised patient to hold NSAIDS & aspirin products for 3 days prior to the procedure beginning on 01/28/2019.  Patient's questions were answered & she expresses understanding of these instructions.    Nicolasa Ducking, RN

## 2019-01-29 ENCOUNTER — Other Ambulatory Visit: Payer: Self-pay

## 2019-01-29 ENCOUNTER — Other Ambulatory Visit
Admission: RE | Admit: 2019-01-29 | Discharge: 2019-01-29 | Disposition: A | Discharge status: Home / Self Care | Payer: Medicare Other | Source: Ambulatory Visit | Attending: Urology | Admitting: Urology

## 2019-01-29 DIAGNOSIS — Z1159 Encounter for screening for other viral diseases: Secondary | ICD-10-CM | POA: Insufficient documentation

## 2019-01-29 LAB — SARS CORONAVIRUS 2 BY RT PCR (HOSPITAL ORDER, PERFORMED IN ~~LOC~~ HOSPITAL LAB): SARS Coronavirus 2: NEGATIVE

## 2019-01-30 ENCOUNTER — Other Ambulatory Visit: Payer: Self-pay | Admitting: Urology

## 2019-01-30 DIAGNOSIS — N200 Calculus of kidney: Secondary | ICD-10-CM

## 2019-01-30 MED ORDER — SULFAMETHOXAZOLE-TRIMETHOPRIM 800-160 MG PO TABS: 1.0000 | ORAL_TABLET | Freq: Two times a day (BID) | ORAL | 0 refills | Status: DC

## 2019-01-31 ENCOUNTER — Ambulatory Visit: Payer: Medicare Other

## 2019-01-31 ENCOUNTER — Encounter: Payer: Self-pay | Admitting: *Deleted

## 2019-01-31 ENCOUNTER — Encounter
Admission: RE | Disposition: A | Discharge status: Home / Self Care | Payer: Self-pay | Source: Home / Self Care | Attending: Urology

## 2019-01-31 ENCOUNTER — Other Ambulatory Visit: Payer: Self-pay

## 2019-01-31 ENCOUNTER — Ambulatory Visit
Admission: RE | Admit: 2019-01-31 | Discharge: 2019-01-31 | Disposition: A | Discharge status: Home / Self Care | Payer: Medicare Other | Attending: Urology | Admitting: Urology

## 2019-01-31 ENCOUNTER — Other Ambulatory Visit: Payer: Self-pay | Admitting: *Deleted

## 2019-01-31 DIAGNOSIS — I2510 Atherosclerotic heart disease of native coronary artery without angina pectoris: Secondary | ICD-10-CM | POA: Insufficient documentation

## 2019-01-31 DIAGNOSIS — Z87442 Personal history of urinary calculi: Secondary | ICD-10-CM | POA: Insufficient documentation

## 2019-01-31 DIAGNOSIS — K8020 Calculus of gallbladder without cholecystitis without obstruction: Secondary | ICD-10-CM | POA: Diagnosis not present

## 2019-01-31 DIAGNOSIS — N200 Calculus of kidney: Secondary | ICD-10-CM | POA: Diagnosis present

## 2019-01-31 DIAGNOSIS — Z951 Presence of aortocoronary bypass graft: Secondary | ICD-10-CM | POA: Insufficient documentation

## 2019-01-31 HISTORY — PX: EXTRACORPOREAL SHOCK WAVE LITHOTRIPSY: SHX1557

## 2019-01-31 LAB — URINALYSIS, COMPLETE (UACMP) WITH MICROSCOPIC
RBC / HPF: >50 RBC/hpf — ABNORMAL HIGH (ref 0–5)
Specific Gravity, Urine: 1.02 (ref 1.005–1.030)
WBC, UA: >50 WBC/hpf — ABNORMAL HIGH (ref 0–5)

## 2019-01-31 SURGERY — LITHOTRIPSY, ESWL
Anesthesia: Moderate Sedation | Laterality: Left

## 2019-01-31 MED ORDER — CIPROFLOXACIN HCL 500 MG PO TABS
500.0000 mg | ORAL_TABLET | ORAL | Status: AC
  Administered 2019-01-31: 500 mg via ORAL

## 2019-01-31 MED ORDER — SODIUM CHLORIDE 0.9 % IV SOLN
INTRAVENOUS | Status: DC
  Administered 2019-01-31: 08:00:00 via INTRAVENOUS

## 2019-01-31 MED ORDER — DIAZEPAM 5 MG PO TABS
ORAL_TABLET | ORAL | Status: AC
  Administered 2019-01-31: 10 mg via ORAL
  Filled 2019-01-31: qty 2

## 2019-01-31 MED ORDER — DIPHENHYDRAMINE HCL 25 MG PO CAPS
25.0000 mg | ORAL_CAPSULE | ORAL | Status: AC
  Administered 2019-01-31: 25 mg via ORAL

## 2019-01-31 MED ORDER — TAMSULOSIN HCL 0.4 MG PO CAPS: 0.4000 mg | ORAL_CAPSULE | Freq: Every day | ORAL | 0 refills | Status: DC

## 2019-01-31 MED ORDER — ONDANSETRON HCL 4 MG/2ML IJ SOLN
INTRAMUSCULAR | Status: AC
  Administered 2019-01-31: 4 mg via INTRAVENOUS
  Filled 2019-01-31: qty 2

## 2019-01-31 MED ORDER — ONDANSETRON HCL 2 MG/ML IV SOLN: 4.0000 mg | Freq: Once | INTRAVENOUS | Status: DC | PRN

## 2019-01-31 MED ORDER — CIPROFLOXACIN HCL 500 MG PO TABS
ORAL_TABLET | ORAL | Status: AC
  Administered 2019-01-31: 500 mg via ORAL
  Filled 2019-01-31: qty 1

## 2019-01-31 MED ORDER — DIPHENHYDRAMINE HCL 25 MG PO CAPS
ORAL_CAPSULE | ORAL | Status: AC
  Administered 2019-01-31: 25 mg via ORAL
  Filled 2019-01-31: qty 1

## 2019-01-31 MED ORDER — HYDROCODONE-ACETAMINOPHEN 5-325 MG PO TABS: 1.0000 | ORAL_TABLET | ORAL | 0 refills | Status: AC | PRN

## 2019-01-31 MED ORDER — ONDANSETRON HCL 4 MG/2ML IJ SOLN
4.0000 mg | Freq: Once | INTRAMUSCULAR | Status: AC | PRN
  Administered 2019-01-31: 4 mg via INTRAVENOUS

## 2019-01-31 MED ORDER — DIAZEPAM 5 MG PO TABS
10.0000 mg | ORAL_TABLET | ORAL | Status: AC
  Administered 2019-01-31: 10 mg via ORAL

## 2019-01-31 NOTE — Discharge Instructions (Signed)
Lithotripsy, Care After °This sheet gives you information about how to care for yourself after your procedure. Your health care provider may also give you more specific instructions. If you have problems or questions, contact your health care provider. °What can I expect after the procedure? °After the procedure, it is common to have: °· Some blood in your urine. This should only last for a few days. °· Soreness in your back, sides, or upper abdomen for a few days. °· Blotches or bruises on your back where the pressure wave entered the skin. °· Pain, discomfort, or nausea when pieces (fragments) of the kidney stone move through the tube that carries urine from the kidney to the bladder (ureter). Stone fragments may pass soon after the procedure, but they may continue to pass for up to 4-8 weeks. °? If you have severe pain or nausea, contact your health care provider. This may be caused by a large stone that was not broken up, and this may mean that you need more treatment. °· Some pain or discomfort during urination. °· Some pain or discomfort in the lower abdomen or (in men) at the base of the penis. °Follow these instructions at home: °Medicines °· Take over-the-counter and prescription medicines only as told by your health care provider. °· If you were prescribed an antibiotic medicine, take it as told by your health care provider. Do not stop taking the antibiotic even if you start to feel better. °· Do not drive for 24 hours if you were given a medicine to help you relax (sedative). °· Do not drive or use heavy machinery while taking prescription pain medicine. °Eating and drinking ° °  ° °· Drink enough water and fluids to keep your urine clear or pale yellow. This helps any remaining pieces of the stone to pass. It can also help prevent new stones from forming. °· Eat plenty of fresh fruits and vegetables. °· Follow instructions from your health care provider about eating and drinking restrictions. You may be  instructed: °? To reduce how much salt (sodium) you eat or drink. Check ingredients and nutrition facts on packaged foods and beverages. °? To reduce how much meat you eat. °· Eat the recommended amount of calcium for your age and gender. Ask your health care provider how much calcium you should have. °General instructions °· Get plenty of rest. °· Most people can resume normal activities 1-2 days after the procedure. Ask your health care provider what activities are safe for you. °· Your health care provider may direct you to lie in a certain position (postural drainage) and tap firmly (percuss) over your kidney area to help stone fragments pass. Follow instructions as told by your health care provider. °· If directed, strain all urine through the strainer that was provided by your health care provider. °? Keep all fragments for your health care provider to see. Any stones that are found may be sent to a medical lab for examination. The stone may be as small as a grain of salt. °· Keep all follow-up visits as told by your health care provider. This is important. °Contact a health care provider if: °· You have pain that is severe or does not get better with medicine. °· You have nausea that is severe or does not go away. °· You have blood in your urine longer than your health care provider told you to expect. °· You have more blood in your urine. °· You have pain during urination that does   not go away. °· You urinate more frequently than usual and this does not go away. °· You develop a rash or any other possible signs of an allergic reaction. °Get help right away if: °· You have severe pain in your back, sides, or upper abdomen. °· You have severe pain while urinating. °· Your urine is very dark red. °· You have blood in your stool (feces). °· You cannot pass any urine at all. °· You feel a strong urge to urinate after emptying your bladder. °· You have a fever or chills. °· You develop shortness of breath,  difficulty breathing, or chest pain. °· You have severe nausea that leads to persistent vomiting. °· You faint. °Summary °· After this procedure, it is common to have some pain, discomfort, or nausea when pieces (fragments) of the kidney stone move through the tube that carries urine from the kidney to the bladder (ureter). If this pain or nausea is severe, however, you should contact your health care provider. °· Most people can resume normal activities 1-2 days after the procedure. Ask your health care provider what activities are safe for you. °· Drink enough water and fluids to keep your urine clear or pale yellow. This helps any remaining pieces of the stone to pass, and it can help prevent new stones from forming. °· If directed, strain your urine and keep all fragments for your health care provider to see. Fragments or stones may be as small as a grain of salt. °· Get help right away if you have severe pain in your back, sides, or upper abdomen or have severe pain while urinating. °This information is not intended to replace advice given to you by your health care provider. Make sure you discuss any questions you have with your health care provider. °Document Released: 09/18/2007 Document Revised: 02/07/2018 Document Reviewed: 07/20/2016 °Elsevier Interactive Patient Education © 2019 [employer] °AMBULATORY SURGERY  °DISCHARGE INSTRUCTIONS ° ° °1) The drugs that you were given will stay in your system until tomorrow so for the next 24 hours you should not: ° °A) Drive an automobile °B) Make any legal decisions °C) Drink any alcoholic beverage ° ° °2) You may resume regular meals tomorrow.  Today it is better to start with liquids and gradually work up to solid foods. ° °You may eat anything you prefer, but it is better to start with liquids, then soup and crackers, and gradually work up to solid foods. ° ° °3) Please notify your doctor immediately if you have any unusual bleeding, trouble breathing, redness  and pain at the surgery site, drainage, fever, or pain not relieved by medication. ° ° ° °4) Additional Instructions: ° ° ° ° ° ° ° °Please contact your physician with any problems or Same Day Surgery at [phone], Monday through Friday 6 am to 4 pm, or [place] at [facility] number at [phone]. °

## 2019-01-31 NOTE — H&P (Signed)
UROLOGY H&P UPDATE  Agree with prior H&P dated 01/09/2019. Patient with history of bilateral UPJ stones s/p bilateral ureteral stent placement with Dr. Lonna Cobb. Here today for left SWL.  Cardiac: RRR Lungs: CTA bilaterally  Laterality: LEFT Procedure: LEFT SWL  Urine: Urinalysis today bloody and unable to interpret dip, few bacteria, >50 RBCs, >50 WBCs. Has been on 24 hours Bactrim to sterilize urine.  Informed consent obtained, we specifically discussed the risks of bleeding, infection, post-operative pain, need for additional procedures, steinstrasse, renal hematoma.  Sondra Come, MD 01/31/2019

## 2019-02-08 ENCOUNTER — Ambulatory Visit
Admission: RE | Admit: 2019-02-08 | Discharge: 2019-02-08 | Disposition: A | Discharge status: Home / Self Care | Payer: Medicare Other | Source: Ambulatory Visit | Attending: Urology | Admitting: Urology

## 2019-02-08 ENCOUNTER — Other Ambulatory Visit: Payer: Self-pay

## 2019-02-08 ENCOUNTER — Ambulatory Visit
Admission: RE | Admit: 2019-02-08 | Discharge: 2019-02-08 | Disposition: A | Discharge status: Home / Self Care | Payer: Medicare Other | Attending: Urology | Admitting: Urology

## 2019-02-08 DIAGNOSIS — N200 Calculus of kidney: Secondary | ICD-10-CM | POA: Diagnosis not present

## 2019-02-20 ENCOUNTER — Other Ambulatory Visit: Payer: Self-pay | Admitting: Urology

## 2019-02-20 DIAGNOSIS — N200 Calculus of kidney: Secondary | ICD-10-CM

## 2019-02-21 ENCOUNTER — Ambulatory Visit
Admission: RE | Admit: 2019-02-21 | Discharge: 2019-02-21 | Disposition: A | Discharge status: Home / Self Care | Payer: Medicare Other | Source: Ambulatory Visit | Attending: Urology | Admitting: Urology

## 2019-02-21 ENCOUNTER — Other Ambulatory Visit: Payer: Self-pay

## 2019-02-21 DIAGNOSIS — N200 Calculus of kidney: Secondary | ICD-10-CM

## 2019-02-22 ENCOUNTER — Other Ambulatory Visit: Payer: Self-pay | Admitting: Family Medicine

## 2019-02-22 DIAGNOSIS — N200 Calculus of kidney: Secondary | ICD-10-CM

## 2019-02-25 ENCOUNTER — Other Ambulatory Visit: Payer: Self-pay

## 2019-02-25 ENCOUNTER — Other Ambulatory Visit
Admission: RE | Admit: 2019-02-25 | Discharge: 2019-02-25 | Disposition: A | Discharge status: Home / Self Care | Payer: Medicare Other | Source: Ambulatory Visit | Attending: Urology | Admitting: Urology

## 2019-02-25 ENCOUNTER — Other Ambulatory Visit: Payer: Self-pay | Admitting: Radiology

## 2019-02-25 ENCOUNTER — Other Ambulatory Visit: Payer: Medicare Other

## 2019-02-25 DIAGNOSIS — N200 Calculus of kidney: Secondary | ICD-10-CM

## 2019-02-25 DIAGNOSIS — Z1159 Encounter for screening for other viral diseases: Secondary | ICD-10-CM | POA: Diagnosis present

## 2019-02-26 LAB — NOVEL CORONAVIRUS, NAA (HOSP ORDER, SEND-OUT TO REF LAB; TAT 18-24 HRS): SARS-CoV-2, NAA: NOT DETECTED

## 2019-02-27 LAB — CULTURE, URINE COMPREHENSIVE

## 2019-02-28 ENCOUNTER — Ambulatory Visit: Payer: Medicare Other

## 2019-02-28 ENCOUNTER — Ambulatory Visit
Admission: RE | Admit: 2019-02-28 | Discharge: 2019-02-28 | Disposition: A | Discharge status: Home / Self Care | Payer: Medicare Other | Attending: Urology | Admitting: Urology

## 2019-02-28 ENCOUNTER — Encounter
Admission: RE | Disposition: A | Discharge status: Home / Self Care | Payer: Self-pay | Source: Home / Self Care | Attending: Urology

## 2019-02-28 ENCOUNTER — Encounter: Payer: Self-pay | Admitting: *Deleted

## 2019-02-28 DIAGNOSIS — N200 Calculus of kidney: Secondary | ICD-10-CM

## 2019-02-28 DIAGNOSIS — E785 Hyperlipidemia, unspecified: Secondary | ICD-10-CM | POA: Diagnosis not present

## 2019-02-28 DIAGNOSIS — Z89211 Acquired absence of right upper limb below elbow: Secondary | ICD-10-CM | POA: Insufficient documentation

## 2019-02-28 DIAGNOSIS — Z7901 Long term (current) use of anticoagulants: Secondary | ICD-10-CM | POA: Insufficient documentation

## 2019-02-28 DIAGNOSIS — E119 Type 2 diabetes mellitus without complications: Secondary | ICD-10-CM | POA: Diagnosis not present

## 2019-02-28 DIAGNOSIS — N132 Hydronephrosis with renal and ureteral calculous obstruction: Secondary | ICD-10-CM | POA: Diagnosis not present

## 2019-02-28 DIAGNOSIS — Z79899 Other long term (current) drug therapy: Secondary | ICD-10-CM | POA: Insufficient documentation

## 2019-02-28 DIAGNOSIS — I4891 Unspecified atrial fibrillation: Secondary | ICD-10-CM | POA: Insufficient documentation

## 2019-02-28 DIAGNOSIS — Z90710 Acquired absence of both cervix and uterus: Secondary | ICD-10-CM | POA: Insufficient documentation

## 2019-02-28 DIAGNOSIS — I252 Old myocardial infarction: Secondary | ICD-10-CM | POA: Diagnosis not present

## 2019-02-28 DIAGNOSIS — Z7982 Long term (current) use of aspirin: Secondary | ICD-10-CM | POA: Diagnosis not present

## 2019-02-28 DIAGNOSIS — Z7984 Long term (current) use of oral hypoglycemic drugs: Secondary | ICD-10-CM | POA: Insufficient documentation

## 2019-02-28 DIAGNOSIS — I2510 Atherosclerotic heart disease of native coronary artery without angina pectoris: Secondary | ICD-10-CM | POA: Diagnosis not present

## 2019-02-28 DIAGNOSIS — Z87891 Personal history of nicotine dependence: Secondary | ICD-10-CM | POA: Diagnosis not present

## 2019-02-28 DIAGNOSIS — Z951 Presence of aortocoronary bypass graft: Secondary | ICD-10-CM | POA: Diagnosis not present

## 2019-02-28 HISTORY — PX: EXTRACORPOREAL SHOCK WAVE LITHOTRIPSY: SHX1557

## 2019-02-28 LAB — GLUCOSE, CAPILLARY: Glucose-Capillary: 148 mg/dL — ABNORMAL HIGH (ref 70–99)

## 2019-02-28 SURGERY — LITHOTRIPSY, ESWL
Anesthesia: Moderate Sedation | Laterality: Left

## 2019-02-28 MED ORDER — SODIUM CHLORIDE 0.9 % IV SOLN
INTRAVENOUS | Status: DC
  Administered 2019-02-28: 08:00:00 via INTRAVENOUS

## 2019-02-28 MED ORDER — ONDANSETRON HCL 4 MG/2ML IJ SOLN
INTRAMUSCULAR | Status: AC
  Filled 2019-02-28: qty 2

## 2019-02-28 MED ORDER — DIPHENHYDRAMINE HCL 25 MG PO CAPS
ORAL_CAPSULE | ORAL | Status: AC
  Filled 2019-02-28: qty 1

## 2019-02-28 MED ORDER — CIPROFLOXACIN HCL 500 MG PO TABS: 500.0000 mg | ORAL_TABLET | Freq: Two times a day (BID) | ORAL | 0 refills | Status: AC

## 2019-02-28 MED ORDER — CIPROFLOXACIN HCL 500 MG PO TABS
500.0000 mg | ORAL_TABLET | ORAL | Status: AC
  Administered 2019-02-28: 500 mg via ORAL

## 2019-02-28 MED ORDER — DIAZEPAM 5 MG PO TABS
10.0000 mg | ORAL_TABLET | ORAL | Status: AC
  Administered 2019-02-28: 10 mg via ORAL

## 2019-02-28 MED ORDER — ONDANSETRON HCL 2 MG/ML IV SOLN: 4.0000 mg | Freq: Once | INTRAVENOUS | Status: DC | PRN

## 2019-02-28 MED ORDER — ONDANSETRON HCL 4 MG/2ML IJ SOLN
4.0000 mg | Freq: Once | INTRAMUSCULAR | Status: AC
  Administered 2019-02-28: 4 mg via INTRAVENOUS

## 2019-02-28 MED ORDER — CIPROFLOXACIN HCL 500 MG PO TABS
ORAL_TABLET | ORAL | Status: AC
  Filled 2019-02-28: qty 1

## 2019-02-28 MED ORDER — DIAZEPAM 5 MG PO TABS
ORAL_TABLET | ORAL | Status: AC
  Filled 2019-02-28: qty 2

## 2019-02-28 MED ORDER — DIPHENHYDRAMINE HCL 25 MG PO CAPS
25.0000 mg | ORAL_CAPSULE | ORAL | Status: AC
  Administered 2019-02-28: 25 mg via ORAL

## 2019-02-28 NOTE — H&P (Signed)
02/28/19 9:32 AM   Sarah Brady 21-Mar-1951 [id number]  Referring provider: No referring provider defined for this encounter.  No chief complaint on file.   HPI: 68 year old female who was initially seen on 11/01/2018 with left lower quadrant abdominal pain and fever.  CT showed a 10 mm right UPJ calculus and a 16 mm left UPJ calculus with mild to moderate hydronephrosis.  She had grossly infected urine and leukocytosis.  She underwent urgent bilateral ureteral stent placement.    She underwent left SWL on 01/31/2019 and has residual lower pole fragments.  She presents for repeat left SWL.   PMH: Past Medical History:  Diagnosis Date  . Atrial fibrillation ([place])   . CAD (coronary artery disease)   . Diabetes mellitus without complication ([place])   . HLD (hyperlipidemia)     Surgical History: Past Surgical History:  Procedure Laterality Date  . ABDOMINAL HYSTERECTOMY  11/21/2018  . 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 CORONARY ARTERY BYPASS GRAFT    . CYSTOSCOPY WITH STENT PLACEMENT Bilateral 11/01/2018   Procedure: CYSTOSCOPY WITH STENT PLACEMENT;  Surgeon: Abbie Sons, MD;  Location: [facility] ORS;  Service: Urology;  Laterality: Bilateral;  . EXTRACORPOREAL SHOCK WAVE LITHOTRIPSY Left 01/31/2019   Procedure: EXTRACORPOREAL SHOCK WAVE LITHOTRIPSY (ESWL);  Surgeon: Billey Co, MD;  Location: [facility] ORS;  Service: Urology;  Laterality: Left;  . TUBAL LIGATION      Home Medications:  . aspirin 81 MG chewable tablet Take 81 mg by mouth once daily  . atorvastatin (LIPITOR) 80 MG tablet Take 1 tablet (80 mg total) by mouth once daily 90 tablet 1  . buPROPion (WELLBUTRIN XL) 150 MG XL tablet Take 150 mg by mouth once daily Take with 300mg  to make a total dose of 450mg   . buPROPion (WELLBUTRIN XL) 300 MG XL tablet TAKE 1 TABLET BY MOUTH EVERY DAY (Patient taking differently: Take 300 mg by mouth once daily Take with 150mg  tablet for total dose of  450mg  ) 90 tablet 1  . clobetasol (TEMOVATE) 0.05 % ointment Apply topically 2 (two) times daily 60 g 2  . enoxaparin (LOVENOX) 40 mg/0.4 mL injection syringe Inject 0.4 mLs (40 mg total) subcutaneously once daily for 25 days 25 Syringe 0  . ergocalciferol, vitamin D2, 50,000 unit capsule Take 1 capsule (50,000 Units total) by mouth once a week (Patient taking differently: Take 50,000 Units by mouth Twice a week ) 12 capsule 3  . FUROsemide (LASIX) 40 MG tablet Take 1 tablet (40 mg total) by mouth 2 (two) times daily 180 tablet 1  . levothyroxine (SYNTHROID) 125 MCG tablet Take 1 tablet (125 mcg total) by mouth once daily 90 tablet 0  . metFORMIN (GLUCOPHAGE-XR) 500 MG XR tablet Take 1 to 2 po with largest meal of the day (Patient taking differently: Take 1,000 mg by mouth once daily ) 60 tablet 5  . metoprolol tartrate (LOPRESSOR) 25 MG tablet Take 1 tablet (25 mg total) by mouth 2 (two) times daily 180 tablet 1  . potassium chloride (KLOR-CON) 10 mEq ER tablet Take 2 tablets (20 mEq total) by mouth once daily (Patient taking differently: Take 10 mEq by mouth 2 (two) times daily ) 60 tablet 3  . sennosides-docusate (SENOKOT-S) 8.6-50 mg tablet Take 2 tablets by mouth 2 (two) times daily 60 tablet 2  . triamcinolone 0.5 % ointment Apply topically 2 (two) times daily as needed 30 g 5   Allergies: No Known  Allergies  Family History: History reviewed. No pertinent family history.  Social History:  reports that she has quit smoking. She has never used smokeless tobacco. She reports current alcohol use of about 1.0 standard drinks of alcohol per week. She reports previous drug use.  ROS: Denies fever, chills, flank pain, chest pain  Physical Exam: BP (!) 137/94   Pulse (!) 123   Temp 99.3 F (37.4 C) (Tympanic)   Resp 14   Ht 5\' 5"  (1.651 m)   Wt 104.3 kg   SpO2 97%   BMI 38.27 kg/m   Constitutional:  Alert and oriented, No acute distress. HEENT: [place] AT, moist mucus membranes.  Trachea  midline, no masses. Cardiovascular: No clubbing, cyanosis, or edema.  RRR Respiratory: Normal respiratory effort, no increased work of breathing. Clear GI: Abdomen is soft, nontender, nondistended, no abdominal masses GU: No CVA tenderness Lymph: No cervical or inguinal lymphadenopathy. Skin: No rashes, bruises or suspicious lesions. Neurologic: Grossly intact, no focal deficits, moving all 4 extremities. Psychiatric: Normal mood and affect.    Assessment & Plan:   Bilateral nephrolithiasis for repeat left SWL.  The indications and nature of the planned procedure were discussed as well as the potential benefits and expected outcome.  Alternatives were discussed as described above.  The most common complications and side effects were discussed as outlined in the [facility] consent form.  It was stressed that there is no guarantee that lithotripsy will be successful and she could require retreatment or alternative treatment.  The rare instance of perirenal bleeding requiring hospitalization, transfusion and rarely surgery were discussed.     Riki AltesScott C Stoioff, MD  [facility] [address], [address] [place], [place] [address] [phone]

## 2019-02-28 NOTE — Interval H&P Note (Signed)
History and Physical Interval Note:  02/28/2019 9:42 AM  Sarah Brady  has presented today for surgery, with the diagnosis of Left Nephrolithiasis.  The various methods of treatment have been discussed with the patient and family. After consideration of risks, benefits and other options for treatment, the patient has consented to  Procedure(s): EXTRACORPOREAL SHOCK WAVE LITHOTRIPSY (ESWL) (Left) as a surgical intervention.  The patient's history has been reviewed, patient examined, no change in status, stable for surgery.  I have reviewed the patient's chart and labs.  Questions were answered to the patient's satisfaction.     [place]

## 2019-02-28 NOTE — Discharge Instructions (Signed)
AMBULATORY SURGERY  DISCHARGE INSTRUCTIONS   1) The drugs that you were given will stay in your system until tomorrow so for the next 24 hours you should not:  A) Drive an automobile B) Make any legal decisions C) Drink any alcoholic beverage   2) You may resume regular meals tomorrow.  Today it is better to start with liquids and gradually work up to solid foods.  You may eat anything you prefer, but it is better to start with liquids, then soup and crackers, and gradually work up to solid foods.   3) Please notify your doctor immediately if you have any unusual bleeding, trouble breathing, redness and pain at the surgery site, drainage, fever, or pain not relieved by medication.    4) Additional Instructions: As per the [facility] discharge instructions.              An antibiotic Rx was sent to your pharmacy.         Please contact your physician with any problems or Same Day Surgery at [phone], Monday through Friday 6 am to 4 pm, or [place] at [facility] number at [phone].

## 2019-03-10 ENCOUNTER — Encounter: Payer: Self-pay | Admitting: Urology

## 2019-03-13 ENCOUNTER — Other Ambulatory Visit: Payer: Self-pay | Admitting: Radiology

## 2019-03-13 DIAGNOSIS — N200 Calculus of kidney: Secondary | ICD-10-CM

## 2019-03-14 ENCOUNTER — Other Ambulatory Visit: Payer: Self-pay | Admitting: Radiology

## 2019-03-14 ENCOUNTER — Ambulatory Visit: Payer: Medicare Other | Admitting: Urology

## 2019-03-14 DIAGNOSIS — N200 Calculus of kidney: Secondary | ICD-10-CM

## 2019-03-19 ENCOUNTER — Other Ambulatory Visit
Admission: RE | Admit: 2019-03-19 | Discharge: 2019-03-19 | Disposition: A | Discharge status: Home / Self Care | Payer: Medicare Other | Source: Ambulatory Visit | Attending: Urology | Admitting: Urology

## 2019-03-19 DIAGNOSIS — N200 Calculus of kidney: Secondary | ICD-10-CM | POA: Insufficient documentation

## 2019-03-19 LAB — URINALYSIS, COMPLETE (UACMP) WITH MICROSCOPIC
Bilirubin Urine: NEGATIVE
Glucose, UA: NEGATIVE mg/dL
Ketones, ur: NEGATIVE mg/dL
Nitrite: NEGATIVE
Protein, ur: 100 mg/dL — AB
RBC / HPF: >50 RBC/hpf — ABNORMAL HIGH (ref 0–5)
Specific Gravity, Urine: 1.014 (ref 1.005–1.030)
WBC, UA: >50 WBC/hpf — ABNORMAL HIGH (ref 0–5)
pH: 5 (ref 5.0–8.0)

## 2019-03-22 LAB — URINE CULTURE: Culture: >=100000 — AB

## 2019-03-25 ENCOUNTER — Other Ambulatory Visit: Payer: Self-pay | Admitting: Urology

## 2019-03-25 ENCOUNTER — Telehealth: Payer: Self-pay | Admitting: Radiology

## 2019-03-25 MED ORDER — CIPROFLOXACIN HCL 250 MG PO TABS: 250.0000 mg | ORAL_TABLET | Freq: Two times a day (BID) | ORAL | 0 refills | Status: DC

## 2019-03-25 NOTE — Telephone Encounter (Signed)
-----   Message from Abbie Sons, MD sent at 03/25/2019  7:11 AM EDT ----- Urine culture positive consistent with colonization however scheduled for lithotripsy this week with Dr. Erlene Quan.  Antibiotic Rx was sent to pharmacy.

## 2019-03-25 NOTE — Telephone Encounter (Signed)
LMOM that script was sent to pharmacy. Requested return call to confirm.

## 2019-03-27 NOTE — H&P (Signed)
03/27/2019 1:25 PM   Sarah Brady 09-12-1951 [id number]   HPI: Sarah Brady is a 68 year old female initially seen 10/2018 with left lower quadrant abdominal pain and fever.  CT showed a 10 mm right UPJ calculus and a 16mm left UPJ calculus with moderate hydronephrosis.  She had grossly infected urine and underwent urgent bilateral stent placement.  Stone treatment was delayed due to incidental finding of a 14 cm pelvic mass and subsequent referral [facility].  She underwent TAH/BSO/omental biopsy/pelvic node sampling with pathology returning serous borderline ovarian tumor.  She is status post left ESWL on 01/31/2019 and repeat left ESWL 02/28/2019 for residual fragments.  She presents today for right ESWL.  PMH: Past Medical History:  Diagnosis Date  . Atrial fibrillation ([facility])   . CAD (coronary artery disease)   . Diabetes mellitus without complication ([facility])   . HLD (hyperlipidemia)     Surgical History: Past Surgical History:  Procedure Laterality Date  . ABDOMINAL HYSTERECTOMY  11/21/2018  . 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 CORONARY ARTERY BYPASS GRAFT    . CYSTOSCOPY WITH STENT PLACEMENT Bilateral 11/01/2018   Procedure: CYSTOSCOPY WITH STENT PLACEMENT;  Surgeon: Riki AltesStoioff,  C, MD;  Location: [facility] ORS;  Service: Urology;  Laterality: Bilateral;  . EXTRACORPOREAL SHOCK WAVE LITHOTRIPSY Left 01/31/2019   Procedure: EXTRACORPOREAL SHOCK WAVE LITHOTRIPSY (ESWL);  Surgeon: Sondra ComeSninsky, Brian C, MD;  Location: [facility] ORS;  Service: Urology;  Laterality: Left;  . EXTRACORPOREAL SHOCK WAVE LITHOTRIPSY Left 02/28/2019   Procedure: EXTRACORPOREAL SHOCK WAVE LITHOTRIPSY (ESWL);  Surgeon: Riki AltesStoioff,  C, MD;  Location: [facility] ORS;  Service: Urology;  Laterality: Left;  . TUBAL LIGATION      Home Medications:  . aspirin 81 MG chewable tablet Take 81 mg by mouth once daily  . atorvastatin (LIPITOR) 80 MG tablet Take 1 tablet (80  mg total) by mouth once daily 90 tablet 1  . buPROPion (WELLBUTRIN XL) 150 MG XL tablet Take 150 mg by mouth once daily Take with 300mg  to make a total dose of 450mg   . buPROPion (WELLBUTRIN XL) 300 MG XL tablet TAKE 1 TABLET BY MOUTH EVERY DAY (Patient taking differently: Take 300 mg by mouth once daily Take with 150mg  tablet for total dose of 450mg  ) 90 tablet 1  . clobetasol (TEMOVATE) 0.05 % ointment Apply topically 2 (two) times daily 60 g 2  . enoxaparin (LOVENOX) 40 mg/0.4 mL injection syringe Inject 0.4 mLs (40 mg total) subcutaneously once daily for 25 days 25 Syringe 0  . ergocalciferol, vitamin D2, 50,000 unit capsule Take 1 capsule (50,000 Units total) by mouth once a week (Patient taking differently: Take 50,000 Units by mouth Twice a week ) 12 capsule 3  . FUROsemide (LASIX) 40 MG tablet Take 1 tablet (40 mg total) by mouth 2 (two) times daily 180 tablet 1  . levothyroxine (SYNTHROID) 125 MCG tablet Take 1 tablet (125 mcg total) by mouth once daily 90 tablet 0  . metFORMIN (GLUCOPHAGE-XR) 500 MG XR tablet Take 1 to 2 po with largest meal of the day (Patient taking differently: Take 1,000 mg by mouth once daily ) 60 tablet 5  . metoprolol tartrate (LOPRESSOR) 25 MG tablet Take 1 tablet (25 mg total) by mouth 2 (two) times daily 180 tablet 1  . potassium chloride (KLOR-CON) 10 mEq ER tablet Take 2 tablets (20 mEq total) by mouth once daily (Patient taking differently: Take 10 mEq by mouth  2 (two) times daily ) 60 tablet 3  . sennosides-docusate (SENOKOT-S) 8.6-50 mg tablet Take 2 tablets by mouth 2 (two) times daily 60 tablet 2  . triamcinolone 0.5 % ointment Apply topically 2 (two) times daily as needed 30 g 5   Allergies: No Known Allergies  Family History: No family history on file.  Social History:  reports that she has quit smoking. She has never used smokeless tobacco. She reports current alcohol use of about 1.0 standard drinks of alcohol per week. She reports previous drug use.   ROS: No significant change from 02/28/2019  Physical Exam: There were no vitals taken for this visit.  Constitutional:  Alert and oriented, No acute distress. HEENT: [place] AT, moist mucus membranes.  Trachea midline, no masses. Cardiovascular: No clubbing, cyanosis, or edema. Respiratory: Normal respiratory effort, no increased work of breathing. GI: Abdomen is soft, nontender, nondistended, no abdominal masses GU: No CVA tenderness Lymph: No cervical or inguinal lymphadenopathy. Skin: No rashes, bruises or suspicious lesions. Neurologic: Grossly intact, no focal deficits, moving all 4 extremities. Psychiatric: Normal mood and affect.   Assessment & Plan:   68 year old female with a 10 mm right renal calculus who presents for shockwave lithotripsy.  The indications and nature of the planned procedure were discussed as well as the potential benefits and expected outcome.  Alternatives were discussed as described above.  The most common complications and side effects were discussed as outlined in the [facility] consent form.  It was stressed that there is no guarantee that lithotripsy will be successful and she could require retreatment or alternative treatment.  The rare instance of perirenal bleeding requiring hospitalization, transfusion and rarely surgery were discussed.    Sarah Brady, Sarah Brady [address] [place], [place] [address] [phone]

## 2019-03-28 ENCOUNTER — Encounter: Payer: Self-pay | Admitting: *Deleted

## 2019-03-28 ENCOUNTER — Encounter
Admission: RE | Disposition: A | Discharge status: Home / Self Care | Payer: Self-pay | Source: Home / Self Care | Attending: Urology

## 2019-03-28 ENCOUNTER — Other Ambulatory Visit: Payer: Self-pay

## 2019-03-28 ENCOUNTER — Ambulatory Visit
Admission: RE | Admit: 2019-03-28 | Discharge: 2019-03-28 | Disposition: A | Discharge status: Home / Self Care | Payer: Medicare Other | Attending: Urology | Admitting: Urology

## 2019-03-28 ENCOUNTER — Ambulatory Visit: Payer: Medicare Other

## 2019-03-28 DIAGNOSIS — E119 Type 2 diabetes mellitus without complications: Secondary | ICD-10-CM | POA: Insufficient documentation

## 2019-03-28 DIAGNOSIS — I4891 Unspecified atrial fibrillation: Secondary | ICD-10-CM | POA: Insufficient documentation

## 2019-03-28 DIAGNOSIS — E785 Hyperlipidemia, unspecified: Secondary | ICD-10-CM | POA: Diagnosis not present

## 2019-03-28 DIAGNOSIS — Z7901 Long term (current) use of anticoagulants: Secondary | ICD-10-CM | POA: Diagnosis not present

## 2019-03-28 DIAGNOSIS — Z87891 Personal history of nicotine dependence: Secondary | ICD-10-CM | POA: Insufficient documentation

## 2019-03-28 DIAGNOSIS — Z1159 Encounter for screening for other viral diseases: Secondary | ICD-10-CM | POA: Insufficient documentation

## 2019-03-28 DIAGNOSIS — Z90722 Acquired absence of ovaries, bilateral: Secondary | ICD-10-CM | POA: Diagnosis not present

## 2019-03-28 DIAGNOSIS — Z79899 Other long term (current) drug therapy: Secondary | ICD-10-CM | POA: Insufficient documentation

## 2019-03-28 DIAGNOSIS — N200 Calculus of kidney: Secondary | ICD-10-CM

## 2019-03-28 DIAGNOSIS — Z7984 Long term (current) use of oral hypoglycemic drugs: Secondary | ICD-10-CM | POA: Diagnosis not present

## 2019-03-28 DIAGNOSIS — Z951 Presence of aortocoronary bypass graft: Secondary | ICD-10-CM | POA: Insufficient documentation

## 2019-03-28 DIAGNOSIS — Z7982 Long term (current) use of aspirin: Secondary | ICD-10-CM | POA: Diagnosis not present

## 2019-03-28 DIAGNOSIS — Z9079 Acquired absence of other genital organ(s): Secondary | ICD-10-CM | POA: Insufficient documentation

## 2019-03-28 DIAGNOSIS — I252 Old myocardial infarction: Secondary | ICD-10-CM | POA: Diagnosis not present

## 2019-03-28 DIAGNOSIS — I2510 Atherosclerotic heart disease of native coronary artery without angina pectoris: Secondary | ICD-10-CM | POA: Insufficient documentation

## 2019-03-28 DIAGNOSIS — Z90710 Acquired absence of both cervix and uterus: Secondary | ICD-10-CM | POA: Diagnosis not present

## 2019-03-28 DIAGNOSIS — N132 Hydronephrosis with renal and ureteral calculous obstruction: Secondary | ICD-10-CM | POA: Insufficient documentation

## 2019-03-28 HISTORY — PX: EXTRACORPOREAL SHOCK WAVE LITHOTRIPSY: SHX1557

## 2019-03-28 LAB — GLUCOSE, CAPILLARY: Glucose-Capillary: 145 mg/dL — ABNORMAL HIGH (ref 70–99)

## 2019-03-28 LAB — SARS CORONAVIRUS 2 BY RT PCR (HOSPITAL ORDER, PERFORMED IN ~~LOC~~ HOSPITAL LAB): SARS Coronavirus 2: NEGATIVE

## 2019-03-28 SURGERY — LITHOTRIPSY, ESWL
Anesthesia: Moderate Sedation | Laterality: Right

## 2019-03-28 MED ORDER — CIPROFLOXACIN HCL 500 MG PO TABS
ORAL_TABLET | ORAL | Status: AC
  Filled 2019-03-28: qty 1

## 2019-03-28 MED ORDER — HYDROCODONE-ACETAMINOPHEN 5-325 MG PO TABS: 1.0000 | ORAL_TABLET | Freq: Four times a day (QID) | ORAL | 0 refills | Status: DC | PRN

## 2019-03-28 MED ORDER — ONDANSETRON HCL 4 MG/2ML IJ SOLN
4.0000 mg | Freq: Once | INTRAMUSCULAR | Status: AC | PRN
  Administered 2019-03-28: 08:00:00 4 mg via INTRAVENOUS

## 2019-03-28 MED ORDER — ONDANSETRON HCL 4 MG/2ML IJ SOLN
INTRAMUSCULAR | Status: AC
  Administered 2019-03-28: 08:00:00 4 mg via INTRAVENOUS
  Filled 2019-03-28: qty 2

## 2019-03-28 MED ORDER — DIAZEPAM 5 MG PO TABS
ORAL_TABLET | ORAL | Status: AC
  Filled 2019-03-28: qty 2

## 2019-03-28 MED ORDER — ONDANSETRON HCL 2 MG/ML IV SOLN: 4.0000 mg | Freq: Once | INTRAVENOUS | Status: DC | PRN

## 2019-03-28 MED ORDER — DIAZEPAM 5 MG PO TABS
10.0000 mg | ORAL_TABLET | ORAL | Status: AC
  Administered 2019-03-28: 08:00:00 10 mg via ORAL

## 2019-03-28 MED ORDER — DIPHENHYDRAMINE HCL 25 MG PO CAPS
ORAL_CAPSULE | ORAL | Status: AC
  Filled 2019-03-28: qty 1

## 2019-03-28 MED ORDER — DIPHENHYDRAMINE HCL 25 MG PO CAPS
25.0000 mg | ORAL_CAPSULE | ORAL | Status: AC
  Administered 2019-03-28: 25 mg via ORAL

## 2019-03-28 MED ORDER — CIPROFLOXACIN HCL 500 MG PO TABS
500.0000 mg | ORAL_TABLET | ORAL | Status: AC
  Administered 2019-03-28: 500 mg via ORAL

## 2019-03-28 MED ORDER — SODIUM CHLORIDE 0.9 % IV SOLN
INTRAVENOUS | Status: DC
  Administered 2019-03-28: 07:00:00 via INTRAVENOUS

## 2019-03-28 NOTE — Discharge Instructions (Signed)
See [facility] discharge instructions in chart.  

## 2019-03-28 NOTE — Interval H&P Note (Signed)
History and Physical Interval Note:  03/28/2019 8:41 AM  Sarah Brady  has presented today for surgery, with the diagnosis of Kidney stone.  The various methods of treatment have been discussed with the patient and family. After consideration of risks, benefits and other options for treatment, the patient has consented to  Procedure(s): EXTRACORPOREAL SHOCK WAVE LITHOTRIPSY (ESWL) (Right) as a surgical intervention.  The patient's history has been reviewed, patient examined, no change in status, stable for surgery.  I have reviewed the patient's chart and labs.  Questions were answered to the patient's satisfaction.    [facility] CTAB  Hollice Espy

## 2019-03-29 ENCOUNTER — Encounter: Payer: Self-pay | Admitting: Urology

## 2019-04-11 ENCOUNTER — Ambulatory Visit
Admission: RE | Admit: 2019-04-11 | Discharge: 2019-04-11 | Disposition: A | Discharge status: Home / Self Care | Payer: Medicare Other | Source: Ambulatory Visit | Attending: Urology | Admitting: Urology

## 2019-04-11 ENCOUNTER — Ambulatory Visit (INDEPENDENT_AMBULATORY_CARE_PROVIDER_SITE_OTHER): Payer: Medicare Other | Admitting: Urology

## 2019-04-11 ENCOUNTER — Other Ambulatory Visit: Payer: Self-pay

## 2019-04-11 ENCOUNTER — Encounter: Payer: Self-pay | Admitting: Urology

## 2019-04-11 VITALS — BP 127/77 | HR 73 | Ht 65.0 in | Wt 216.0 lb

## 2019-04-11 DIAGNOSIS — N200 Calculus of kidney: Secondary | ICD-10-CM

## 2019-04-11 LAB — MICROSCOPIC EXAMINATION
RBC, Urine: >30 /hpf — AB (ref 0–2)
WBC, UA: >30 /hpf — AB (ref 0–5)

## 2019-04-11 LAB — URINALYSIS, COMPLETE
Bilirubin, UA: NEGATIVE
Glucose, UA: NEGATIVE
Ketones, UA: NEGATIVE
Nitrite, UA: NEGATIVE
Specific Gravity, UA: 1.01 (ref 1.005–1.030)
Urobilinogen, Ur: 0.2 mg/dL (ref 0.2–1.0)
pH, UA: 5 (ref 5.0–7.5)

## 2019-04-11 MED ORDER — LEVOFLOXACIN 500 MG PO TABS
500.0000 mg | ORAL_TABLET | Freq: Once | ORAL | Status: AC
  Administered 2019-04-11: 500 mg via ORAL

## 2019-04-11 MED ORDER — LIDOCAINE HCL URETHRAL/MUCOSAL 2 % EX GEL
1.0000 "application " | Freq: Once | CUTANEOUS | Status: AC
  Administered 2019-04-11: 1 via URETHRAL

## 2019-04-11 NOTE — Patient Instructions (Signed)

## 2019-04-11 NOTE — Progress Notes (Signed)
Indications: Patient is 68 y.o., female with bilateral obstructing renal calculi status post stent placement.  Her left renal calculus was treated with shockwave lithotripsy x2.  Her right renal calculus was treated by shockwave lithotripsy on 03/28/2019.  She had no postoperative complaints.  KUB today shows no calcifications or stone fragments overlying the right renal outline or stent.  Scattered small calculi overlying the lower portion of the left renal outline.  The patient is presenting today for stent removal.  Procedure:  Flexible Cystoscopy with stent removal (15056)  Timeout was performed and the correct patient, procedure and participants were identified.    Description:  The patient was prepped and draped in the usual sterile fashion. Flexible cystosopy was performed.  The left stent was visualized, grasped, and removed intact without difficulty.  The cystoscope was reinserted and the right stent was removed in a similar fashion the patient tolerated the procedure well.  A single dose of oral antibiotics was given.  Complications:  None  Plan: She will instructed to call for development of flank pain post stent removal.  Have recommended a metabolic evaluation with blood work and a 24-hour urine study through [facility].  She will be notified with results.  Follow-up in 3-4 months with a KUB.  John Giovanni, MD

## 2019-05-23 ENCOUNTER — Encounter: Payer: Self-pay | Admitting: Urology

## 2019-07-22 IMAGING — CR ABDOMEN - 1 VIEW
1 series · 2 of 2 positions shown · non-contrast
Comparison: 02/08/2019

CLINICAL DATA: Follow-up renal calculi

EXAM:
ABDOMEN - 1 VIEW

[Series 1: dg abd 1 view · 0.14mm/px · 2 of 2 slices shown]
[im 1/2]
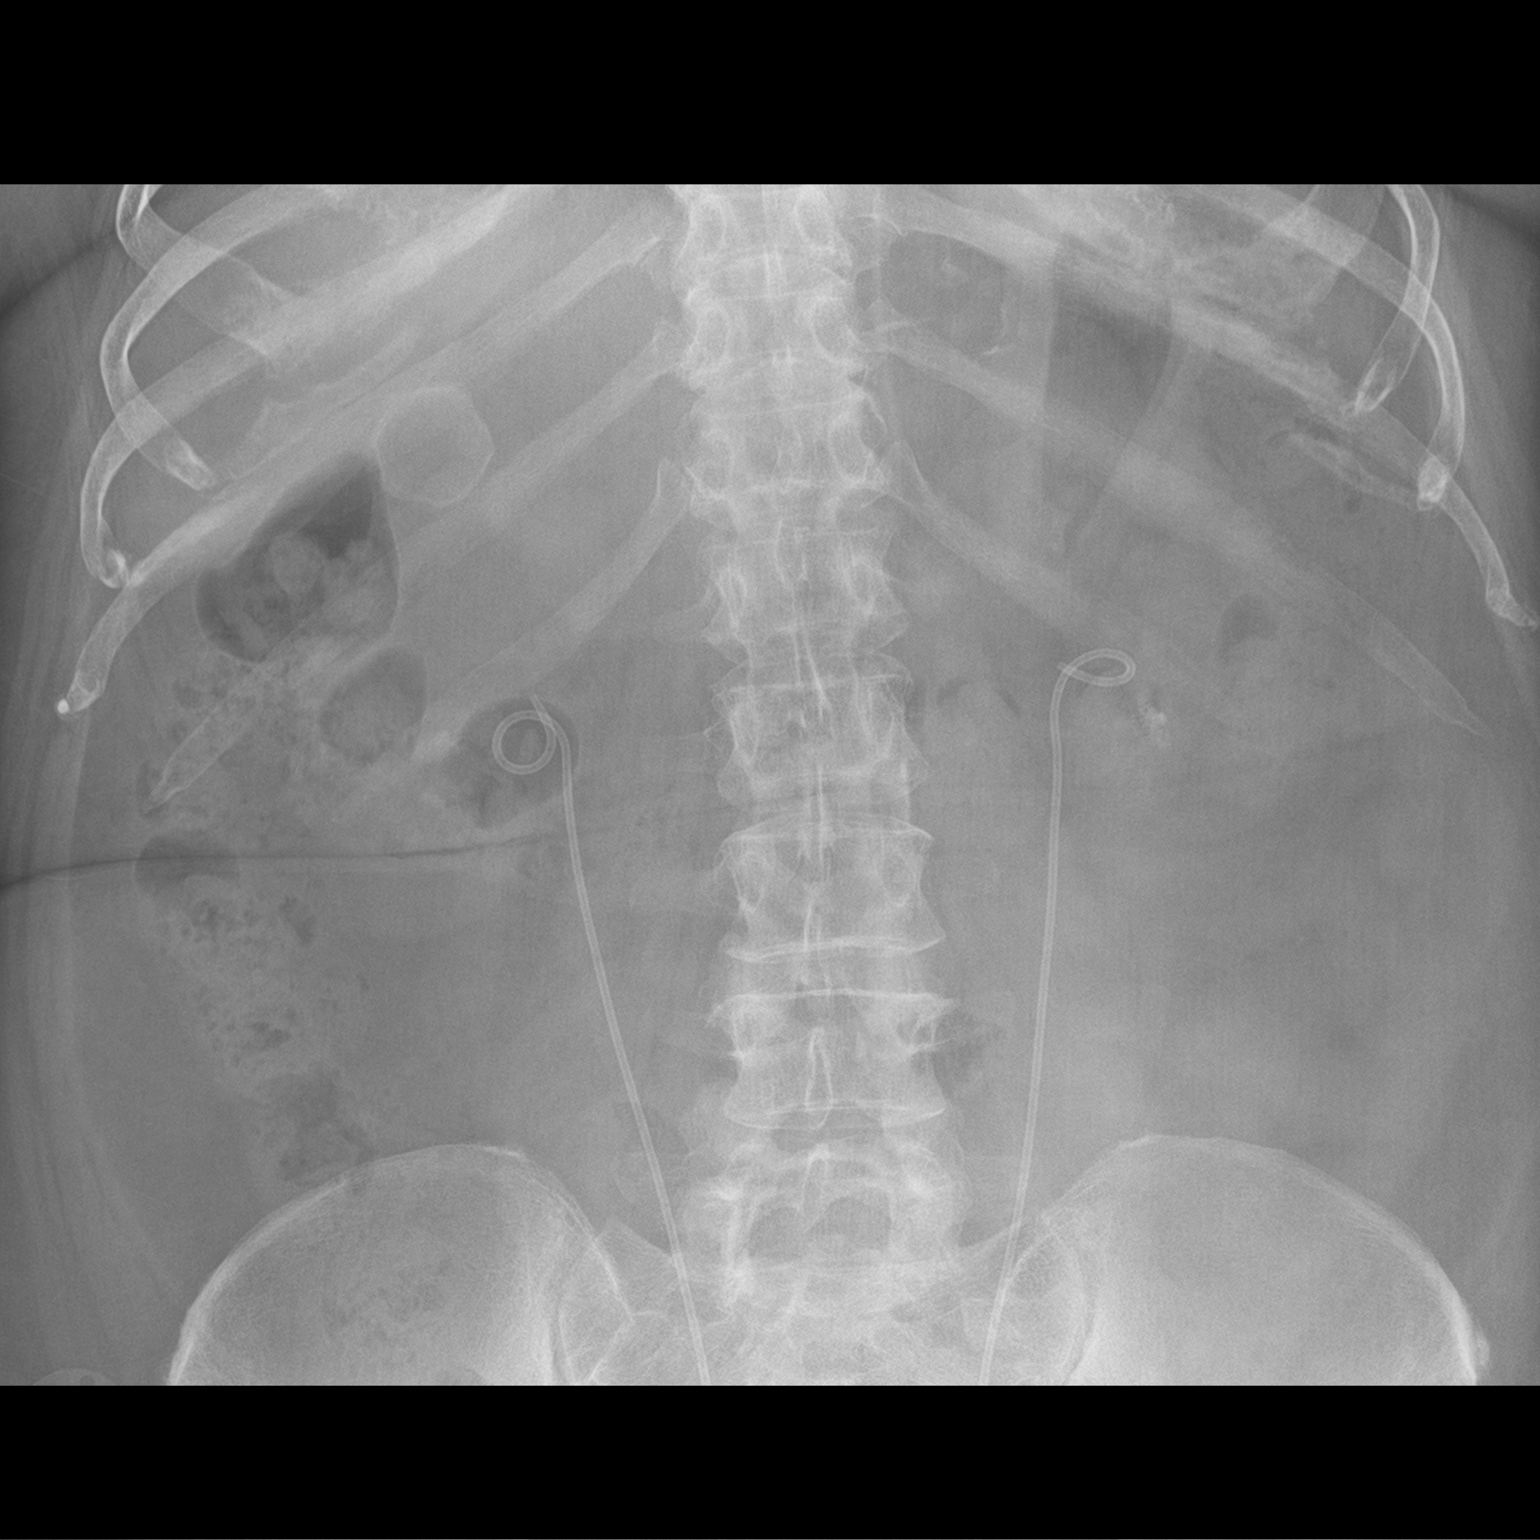
[im 2/2]
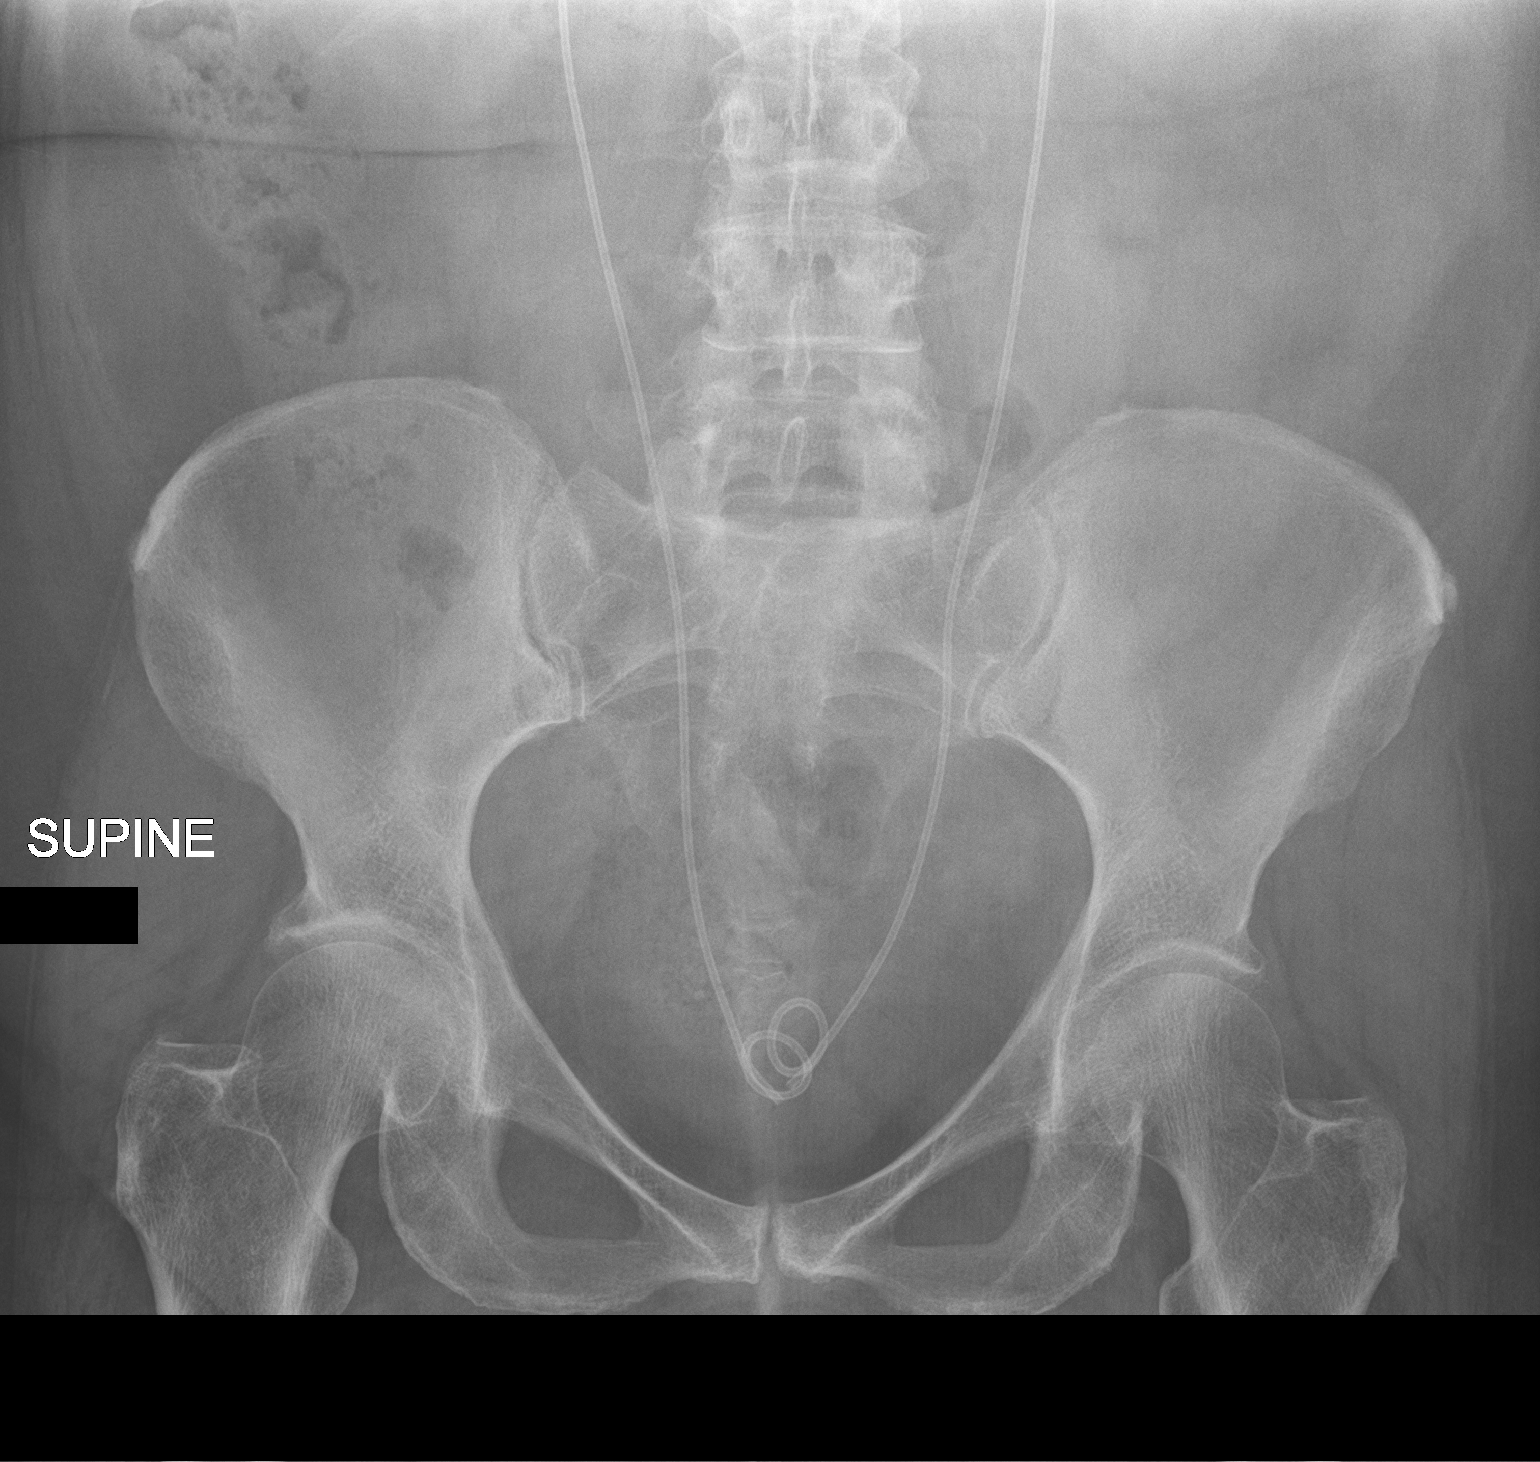

[2 of 2 positions shown; findings below may reference images not displayed]

FINDINGS: Scattered large and small bowel gas is noted. Calcified gallstone is
noted in the right upper quadrant. Bilateral ureteral stents are
noted in satisfactory position. Bilateral renal calculi are noted
similar in appearance to that seen on the prior exam. Scattered
fecal material is seen. No free air is noted.
IMPRESSION: Stable bilateral renal calculi. Ureteral stents are again seen and
stable.

Large calcified gallstone is again noted.

## 2019-07-25 ENCOUNTER — Encounter: Payer: Self-pay | Admitting: Urology

## 2019-07-25 ENCOUNTER — Other Ambulatory Visit: Payer: Self-pay

## 2019-07-25 ENCOUNTER — Ambulatory Visit
Admission: RE | Admit: 2019-07-25 | Discharge: 2019-07-25 | Disposition: A | Discharge status: Home / Self Care | Payer: Medicare Other | Source: Ambulatory Visit | Attending: Urology | Admitting: Urology

## 2019-07-25 ENCOUNTER — Ambulatory Visit (INDEPENDENT_AMBULATORY_CARE_PROVIDER_SITE_OTHER): Payer: Medicare Other | Admitting: Urology

## 2019-07-25 VITALS — BP 155/84 | HR 75 | Ht 65.0 in | Wt 210.0 lb

## 2019-07-25 DIAGNOSIS — N200 Calculus of kidney: Secondary | ICD-10-CM | POA: Insufficient documentation

## 2019-07-25 NOTE — Progress Notes (Signed)
07/25/2019 1:29 PM   Sarah Brady [birth year] [id number]  Referring provider: Clarisse Gouge, MD [address] [place],  [place] [address]  Chief Complaint  Patient presents with  . Follow-up    Urologic history: 1. Nephrolithiasis -Bilateral ureteral stent placement 10/2018 for bilateral obstructing UPJ calculi with pyelonephritis -SWL 03/02/2019 and 04/01/2019 -Stent removal 04/11/2019 -CT 10/2018 showed additional small, bilateral nonobstructing renal calculi.  HPI: 68 y.o. female presents for follow-up.  She has no complaints today and states she is doing very well.  Denies flank, abdominal or pelvic pain.  She did her 24 urine study in late September.  KUB performed today was reviewed which showed a large gallstone in the right upper quadrant.  No definite renal calcifications were identified.  Right pelvic phlebolith is present.  24-hour urine study was completed in early October and she had several abnormalities including low urine volume at 1.11 L, hypocitraturia at 331 mg and low urine magnesium.  Calcium oxalate supersaturation was 9.67.  PMH: Past Medical History:  Diagnosis Date  . Atrial fibrillation ([facility])   . CAD (coronary artery disease)   . Diabetes mellitus without complication ([place])   . HLD (hyperlipidemia)     Surgical History: Past Surgical History:  Procedure Laterality Date  . ABDOMINAL HYSTERECTOMY  11/21/2018  . 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 CORONARY ARTERY BYPASS GRAFT    . CYSTOSCOPY WITH STENT PLACEMENT Bilateral 11/01/2018   Procedure: CYSTOSCOPY WITH STENT PLACEMENT;  Surgeon: Abbie Sons, MD;  Location: [facility];  Service: Urology;  Laterality: Bilateral;  . EXTRACORPOREAL SHOCK WAVE LITHOTRIPSY Left 01/31/2019   Procedure: EXTRACORPOREAL SHOCK WAVE LITHOTRIPSY (ESWL);  Surgeon: Billey Co, MD;  Location: [facility];  Service: Urology;  Laterality: Left;  .  EXTRACORPOREAL SHOCK WAVE LITHOTRIPSY Left 02/28/2019   Procedure: EXTRACORPOREAL SHOCK WAVE LITHOTRIPSY (ESWL);  Surgeon: Abbie Sons, MD;  Location: [facility];  Service: Urology;  Laterality: Left;  . EXTRACORPOREAL SHOCK WAVE LITHOTRIPSY Right 03/28/2019   Procedure: EXTRACORPOREAL SHOCK WAVE LITHOTRIPSY (ESWL);  Surgeon: Hollice Espy, MD;  Location: [facility];  Service: Urology;  Laterality: Right;  . TUBAL LIGATION      Home Medications:  Allergies as of 07/25/2019   No Known Allergies     Medication List       Accurate as of July 25, 2019  1:29 PM. If you have any questions, ask your nurse or doctor.        STOP taking these medications   ciprofloxacin 250 MG tablet Commonly known as: CIPRO Stopped by: Abbie Sons, MD   HYDROcodone-acetaminophen 5-325 MG tablet Commonly known as: NORCO/VICODIN Stopped by: Abbie Sons, MD     TAKE these medications   aspirin 81 MG chewable tablet Chew 1 tablet (81 mg total) by mouth daily with breakfast.   atorvastatin 80 MG tablet Commonly known as: LIPITOR Take 80 mg by mouth daily.   buPROPion 300 MG 24 hr tablet Commonly known as: WELLBUTRIN XL Take 300 mg by mouth daily.   buPROPion 150 MG 24 hr tablet Commonly known as: WELLBUTRIN XL Take 150 mg by mouth daily.   clobetasol ointment 0.05 % Commonly known as: TEMOVATE Apply 8.46 application topically as needed.   furosemide 40 MG tablet Commonly known as: LASIX Take 40 mg by mouth 2 (two) times daily.   levothyroxine 125 MCG tablet Commonly known as: SYNTHROID Take 125 mcg by mouth daily.  metFORMIN 500 MG 24 hr tablet Commonly known as: GLUCOPHAGE-XR Take 1,000 mg by mouth daily. With largest meal of the day.   metoprolol tartrate 50 MG tablet Commonly known as: LOPRESSOR Take 1 tablet (50 mg total) by mouth 2 (two) times daily.   potassium chloride 10 MEQ tablet Commonly known as: KLOR-CON Take 10 mEq by mouth 2 (two) times daily.    tamsulosin 0.4 MG Caps capsule Commonly known as: Flomax Take 1 capsule (0.4 mg total) by mouth daily after supper.   Vitamin D (Ergocalciferol) 1.25 MG (50000 UT) Caps capsule Commonly known as: DRISDOL Take 50,000 Units by mouth 2 (two) times a week.       Allergies: No Known Allergies  Family History: No family history on file.  Social History:  reports that she has quit smoking. She has never used smokeless tobacco. She reports current alcohol use of about 1.0 standard drinks of alcohol per week. She reports previous drug use.  ROS: UROLOGY Frequent Urination?: No Hard to postpone urination?: No Burning/pain with urination?: No Get up at night to urinate?: No Leakage of urine?: No Urine stream starts and stops?: No Trouble starting stream?: No Do you have to strain to urinate?: No Blood in urine?: No Urinary tract infection?: No Sexually transmitted disease?: No Injury to kidneys or bladder?: No Painful intercourse?: No Weak stream?: No Currently pregnant?: No Vaginal bleeding?: No Last menstrual period?: n  Gastrointestinal Nausea?: No Vomiting?: No Indigestion/heartburn?: No Diarrhea?: No Constipation?: No  Constitutional Fever: No Night sweats?: No Weight loss?: No Fatigue?: No  Skin Skin rash/lesions?: No Itching?: No  Eyes Blurred vision?: No Double vision?: No  Ears/Nose/Throat Sore throat?: No Sinus problems?: No  Hematologic/Lymphatic Swollen glands?: No Easy bruising?: No  Cardiovascular Leg swelling?: No Chest pain?: No  Respiratory Cough?: No Shortness of breath?: No  Endocrine Excessive thirst?: No  Musculoskeletal Back pain?: No  Neurological Headaches?: No Dizziness?: No  Psychologic Depression?: No Anxiety?: No  Physical Exam: BP (!) 155/84   Pulse 75   Ht 5\' 5"  (1.651 m)   Wt 210 lb (95.3 kg)   BMI 34.95 kg/m   Constitutional:  Alert and oriented, No acute distress. HEENT: [place] AT, moist mucus  membranes.  Trachea midline, no masses. Cardiovascular: No clubbing, cyanosis, or edema. Respiratory: Normal respiratory effort, no increased work of breathing. GI: Abdomen is soft, nontender, nondistended, no abdominal masses GU: No CVA tenderness Lymph: No cervical or inguinal lymphadenopathy. Skin: No rashes, bruises or suspicious lesions. Neurologic: Grossly intact, no focal deficits, moving all 4 extremities. Psychiatric: Normal mood and affect.  Laboratory Data:     Assessment & Plan:    - Nephrolithiasis Negative KUB today although prior CT showed small, bilateral renal calculi.  25 urine study remarkable for low urine volume, hypocitraturia and low urine magnesium.  We discussed increasing her water intake to keep urine output greater than 2-2.5 L/day.  Would recommend starting LithoLyte to increase her urine citrate.  We also discussed magnesium oxide supplementation.  Recommend a 1 year follow-up with KUB.  Repeat 24 urine study 1 year.    , MD  [facility] [address], [address] [place], [place] [phone]

## 2019-07-28 ENCOUNTER — Encounter: Payer: Self-pay | Admitting: Urology

## 2019-07-28 ENCOUNTER — Telehealth (INDEPENDENT_AMBULATORY_CARE_PROVIDER_SITE_OTHER): Payer: Medicare Other | Admitting: Urology

## 2019-07-28 DIAGNOSIS — N200 Calculus of kidney: Secondary | ICD-10-CM | POA: Diagnosis not present

## 2019-07-28 NOTE — Telephone Encounter (Signed)
She had 3 abnormalities on her 24-hour urine study.  The first was low urine volume at approximately 1 L.  Would recommend she increase her water intake to keep urine output greater than 2 L/day.  Typically around 3 L of water intake will produce this output.  Her urine citrate levels were also low and would recommend starting LithoLyte supplement 1 packet twice daily.  Her magnesium level was also low and would recommend starting magnesium oxide supplement 650 mg daily.

## 2019-07-29 NOTE — Telephone Encounter (Signed)
Called pt no answer. Left generic message for pt to call back as no DPR is on file. Information also sent via mychart.

## 2020-07-24 ENCOUNTER — Ambulatory Visit: Payer: Medicare Other | Admitting: Urology

## 2020-08-10 ENCOUNTER — Other Ambulatory Visit: Payer: Self-pay | Admitting: *Deleted

## 2020-08-10 ENCOUNTER — Ambulatory Visit (INDEPENDENT_AMBULATORY_CARE_PROVIDER_SITE_OTHER): Payer: Medicare Other | Admitting: Urology

## 2020-08-10 ENCOUNTER — Encounter: Payer: Self-pay | Admitting: Urology

## 2020-08-10 ENCOUNTER — Other Ambulatory Visit: Payer: Self-pay

## 2020-08-10 ENCOUNTER — Ambulatory Visit
Admission: RE | Admit: 2020-08-10 | Discharge: 2020-08-10 | Disposition: A | Discharge status: Home / Self Care | Payer: Medicare Other | Source: Ambulatory Visit | Attending: Urology | Admitting: Urology

## 2020-08-10 ENCOUNTER — Ambulatory Visit
Admission: RE | Admit: 2020-08-10 | Discharge: 2020-08-10 | Disposition: A | Discharge status: Home / Self Care | Payer: Medicare Other | Attending: Urology | Admitting: Urology

## 2020-08-10 VITALS — BP 122/80 | HR 76 | Ht 65.0 in | Wt 220.0 lb

## 2020-08-10 DIAGNOSIS — N200 Calculus of kidney: Secondary | ICD-10-CM

## 2020-08-10 NOTE — Progress Notes (Signed)
08/10/2020 10:25 AM   Sarah Brady 09-Feb-1951 [id number]  Referring provider: Verner Mould, MD No address on file  Chief Complaint  Patient presents with  . Nephrolithiasis    Urologic history: 1. Nephrolithiasis -Bilateral ureteral stent placement 10/2018 for bilateral obstructing UPJ calculi with pyelonephritis -SWL 03/02/2019 and 04/01/2019 -Stent removal 04/11/2019 -CT 10/2018 showed additional small, bilateral nonobstructing renal calculi. -Metabolic evaluation remarkable for low urine volume, hypocitraturia and low magnesium  HPI: 69 y.o. female presents for annual follow-up.   No complaints since last years visit and specifically denies flank, abdominal, pelvic pain  No bothersome LUTS or gross hematuria  KUB today reviewed and there is a small right lower pole calcification measuring approximately 4-5 mm which was present on KUB of 2020   PMH: Past Medical History:  Diagnosis Date  . Atrial fibrillation ([facility])   . CAD (coronary artery disease)   . Diabetes mellitus without complication ([facility])   . HLD (hyperlipidemia)     Surgical History: Past Surgical History:  Procedure Laterality Date  . ABDOMINAL HYSTERECTOMY  11/21/2018  . 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 CORONARY ARTERY BYPASS GRAFT    . CYSTOSCOPY WITH STENT PLACEMENT Bilateral 11/01/2018   Procedure: CYSTOSCOPY WITH STENT PLACEMENT;  Surgeon: Riki Altes, MD;  Location: [facility];  Service: Urology;  Laterality: Bilateral;  . EXTRACORPOREAL SHOCK WAVE LITHOTRIPSY Left 01/31/2019   Procedure: EXTRACORPOREAL SHOCK WAVE LITHOTRIPSY (ESWL);  Surgeon: Sondra Come, MD;  Location: [facility];  Service: Urology;  Laterality: Left;  . EXTRACORPOREAL SHOCK WAVE LITHOTRIPSY Left 02/28/2019   Procedure: EXTRACORPOREAL SHOCK WAVE LITHOTRIPSY (ESWL);  Surgeon: Riki Altes, MD;  Location: [facility];  Service: Urology;  Laterality: Left;  . EXTRACORPOREAL SHOCK WAVE  LITHOTRIPSY Right 03/28/2019   Procedure: EXTRACORPOREAL SHOCK WAVE LITHOTRIPSY (ESWL);  Surgeon: Vanna Scotland, MD;  Location: [facility];  Service: Urology;  Laterality: Right;  . TUBAL LIGATION      Home Medications:  Allergies as of 08/10/2020   No Known Allergies     Medication List       Accurate as of August 10, 2020 10:25 AM. If you have any questions, ask your nurse or doctor.        STOP taking these medications   buPROPion 150 MG 24 hr tablet Commonly known as: WELLBUTRIN XL Stopped by: Riki Altes, MD   buPROPion 300 MG 24 hr tablet Commonly known as: WELLBUTRIN XL Stopped by: Riki Altes, MD     TAKE these medications   aspirin 81 MG chewable tablet Chew 1 tablet (81 mg total) by mouth daily with breakfast.   atorvastatin 80 MG tablet Commonly known as: LIPITOR Take 80 mg by mouth daily.   furosemide 40 MG tablet Commonly known as: LASIX Take 40 mg by mouth 2 (two) times daily.   levothyroxine 125 MCG tablet Commonly known as: SYNTHROID Take 125 mcg by mouth daily.   metFORMIN 500 MG 24 hr tablet Commonly known as: GLUCOPHAGE-XR Take 1,000 mg by mouth daily. With largest meal of the day.   metoprolol tartrate 50 MG tablet Commonly known as: LOPRESSOR Take 1 tablet (50 mg total) by mouth 2 (two) times daily.   potassium chloride 10 MEQ tablet Commonly known as: KLOR-CON Take 10 mEq by mouth 2 (two) times daily.   tamsulosin 0.4 MG Caps capsule Commonly known as: Flomax Take 1 capsule (0.4 mg total) by mouth daily after supper.  Allergies: No Known Allergies  Family History: History reviewed. No pertinent family history.  Social History:  reports that she has quit smoking. She has never used smokeless tobacco. She reports current alcohol use of about 1.0 standard drink of alcohol per week. She reports previous drug use.   Physical Exam: BP 122/80   Pulse 76   Ht 5\' 5"  (1.651 m)   Wt 220 lb (99.8 kg)   BMI 36.61 kg/m    Constitutional:  Alert and oriented, No acute distress.   Pertinent Imaging: KUB performed today was personally reviewed and interpreted   Assessment & Plan:    1. Right nephrolithiasis  Asymptomatic right lower pole calcification  We discussed management options of observation, shockwave lithotripsy and she has elected observation  Schedule I year follow-up with KUB  We also discussed follow-up 24 urine study and since no evidence of increasing stone burden declines repeat study at this time   , MD  [facility] [address] [place], [place] [phone]

## 2020-12-03 ENCOUNTER — Other Ambulatory Visit: Payer: Self-pay | Admitting: Internal Medicine

## 2020-12-03 ENCOUNTER — Other Ambulatory Visit (HOSPITAL_COMMUNITY): Payer: Self-pay | Admitting: Internal Medicine

## 2020-12-03 DIAGNOSIS — Z1231 Encounter for screening mammogram for malignant neoplasm of breast: Secondary | ICD-10-CM

## 2020-12-03 DIAGNOSIS — R7989 Other specified abnormal findings of blood chemistry: Secondary | ICD-10-CM

## 2021-01-01 ENCOUNTER — Other Ambulatory Visit: Payer: Self-pay

## 2021-01-01 ENCOUNTER — Ambulatory Visit
Admission: RE | Admit: 2021-01-01 | Discharge: 2021-01-01 | Disposition: A | Discharge status: Home / Self Care | Payer: Medicare Other | Source: Ambulatory Visit | Attending: Internal Medicine | Admitting: Internal Medicine

## 2021-01-01 DIAGNOSIS — R7989 Other specified abnormal findings of blood chemistry: Secondary | ICD-10-CM | POA: Diagnosis present

## 2021-01-08 IMAGING — CR DG ABDOMEN 1V
1 series · 2 of 2 positions shown · non-contrast
Comparison: CT abdomen 11/01/2018 and abdominal radiographs
08/02/2019

CLINICAL DATA: Nephrolithiasis.

EXAM:
ABDOMEN - 1 VIEW

[Series 1: dg abd 1 view · 0.14mm/px · 2 of 2 slices shown]
[im 1/2]
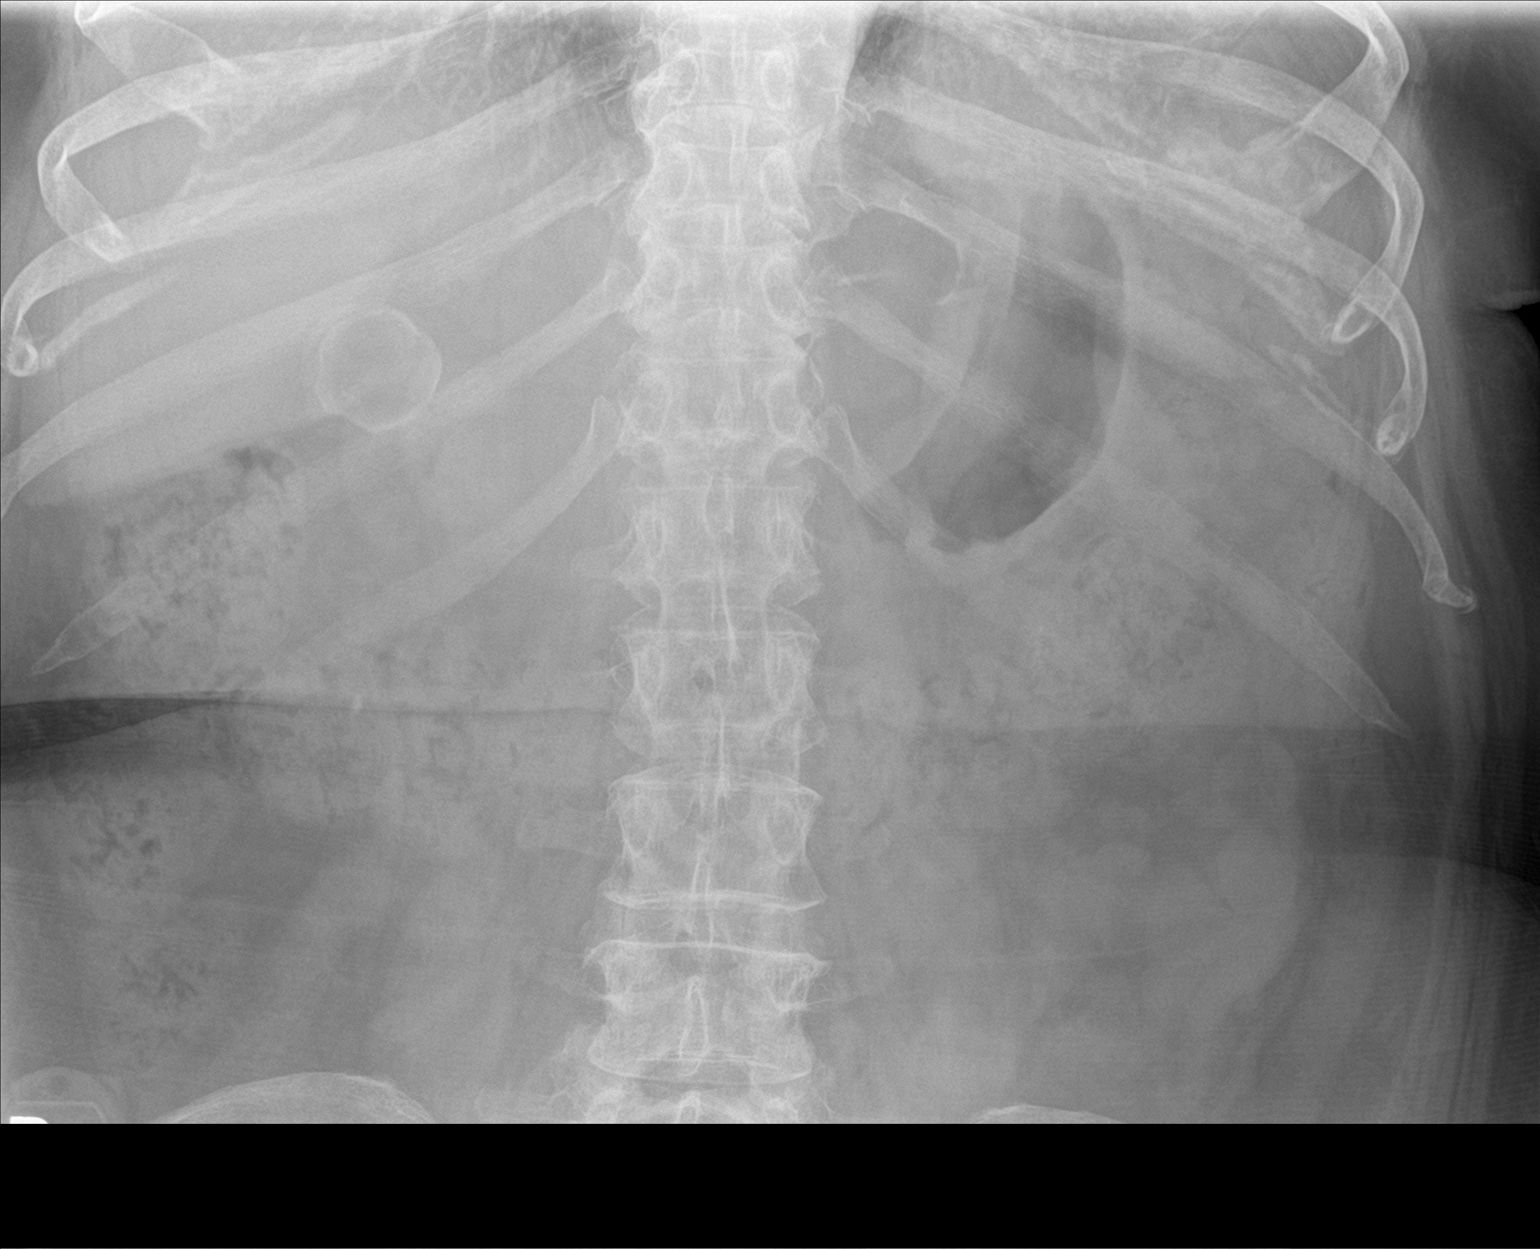
[im 2/2]
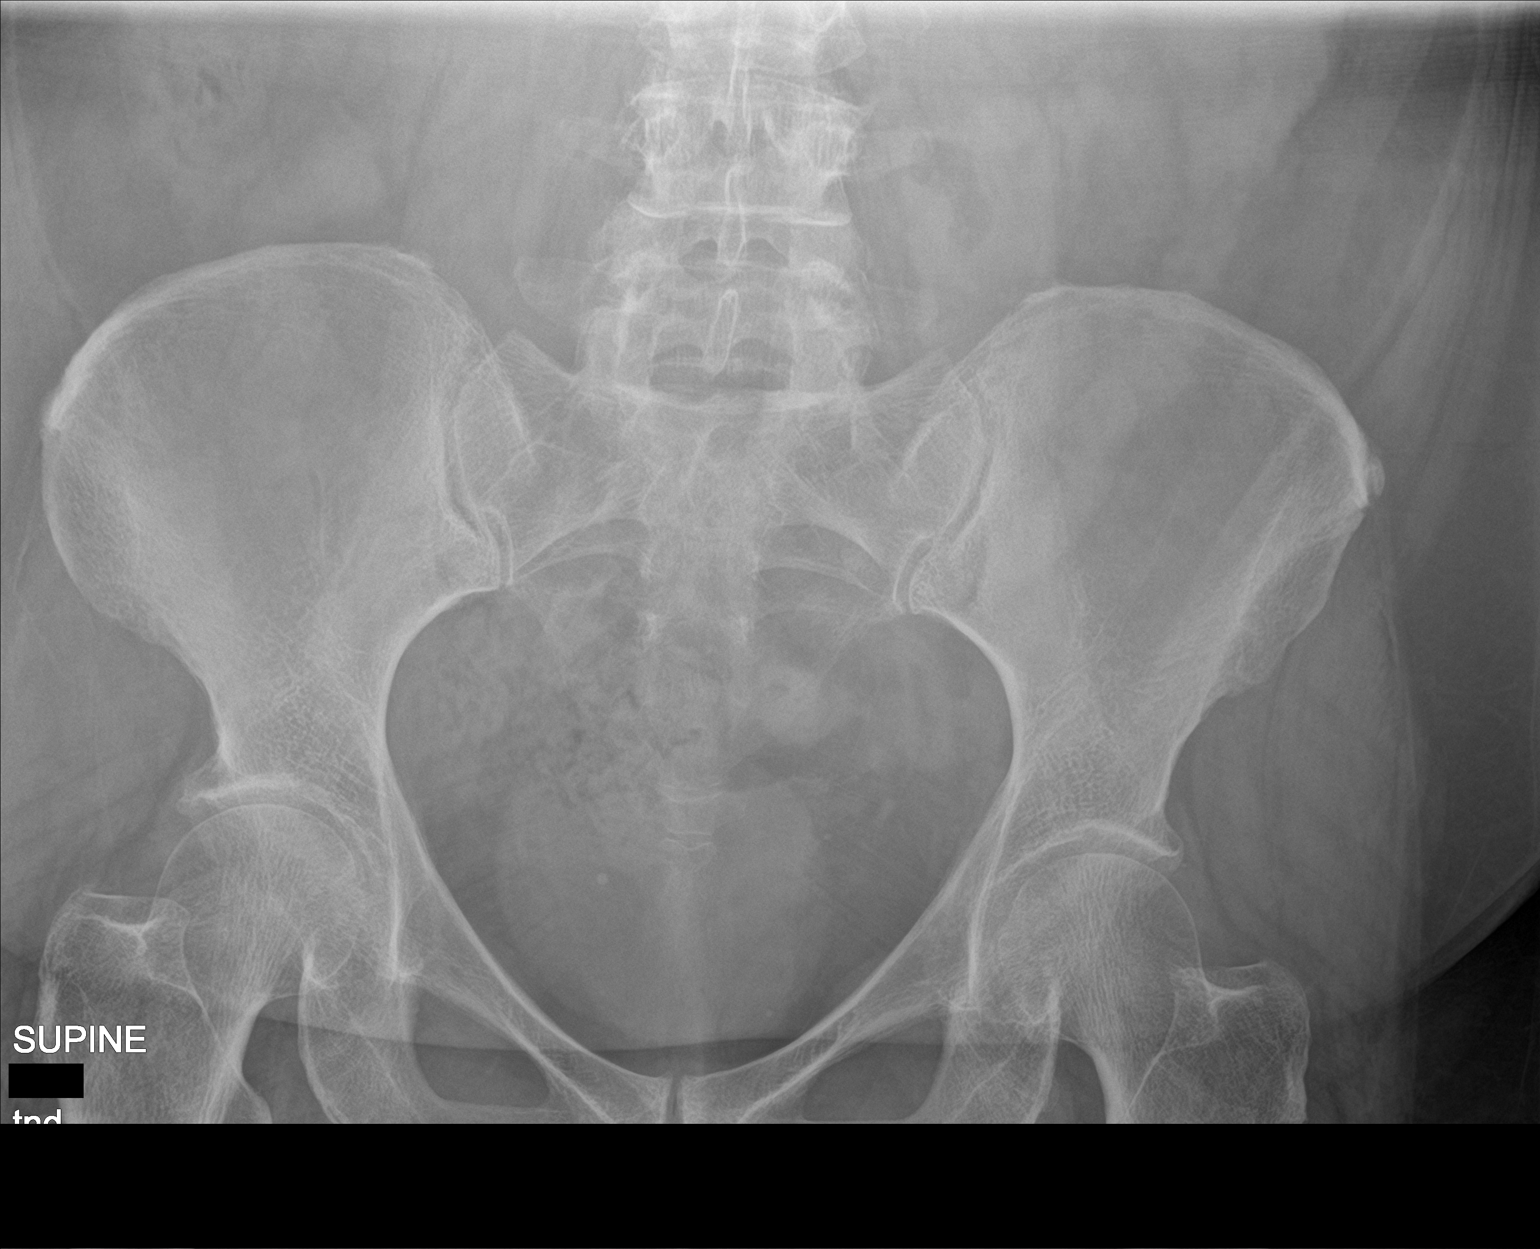

[2 of 2 positions shown; findings below may reference images not displayed]

FINDINGS: Again noted is a large calcified gallstone that measures up to
cm. 5 mm stone overlying the right kidney lower pole. Questionable
small stones in the left kidney lower pole but this area is limited
due to overlying colonic stool and bowel gas. Nonobstructive bowel
gas pattern. Small pelvic calcifications are most compatible with
phleboliths.
IMPRESSION: 1. Small stone involving the right kidney lower pole. Cannot exclude
additional small left renal calculi.
2. Cholelithiasis.

## 2021-06-01 IMAGING — US US ABDOMEN LIMITED RUQ/ASCITES
1 series · 14 of 25 positions shown · non-contrast
Comparison: None.

CLINICAL DATA: Elevated liver function tests

EXAM:
ULTRASOUND ABDOMEN LIMITED RIGHT UPPER QUADRANT

[Series 1: us abdomen limited ruq/ascites · 0.23mm/px · 14 of 49 slices shown]
[im 1/49]
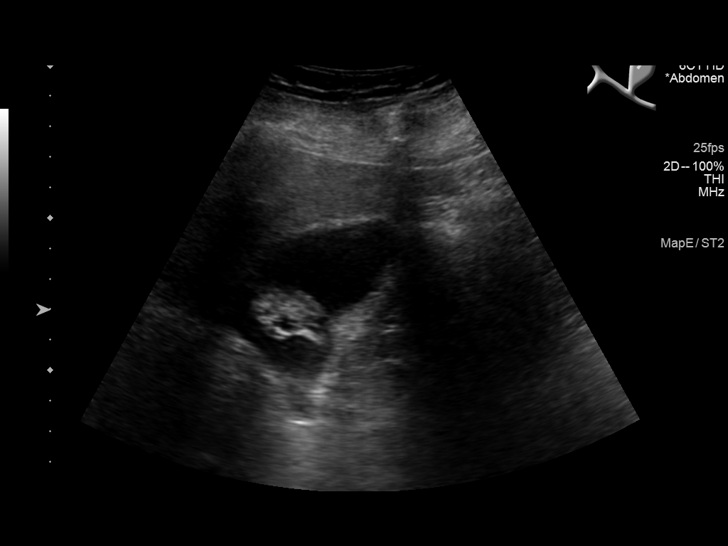
[im 5/49]
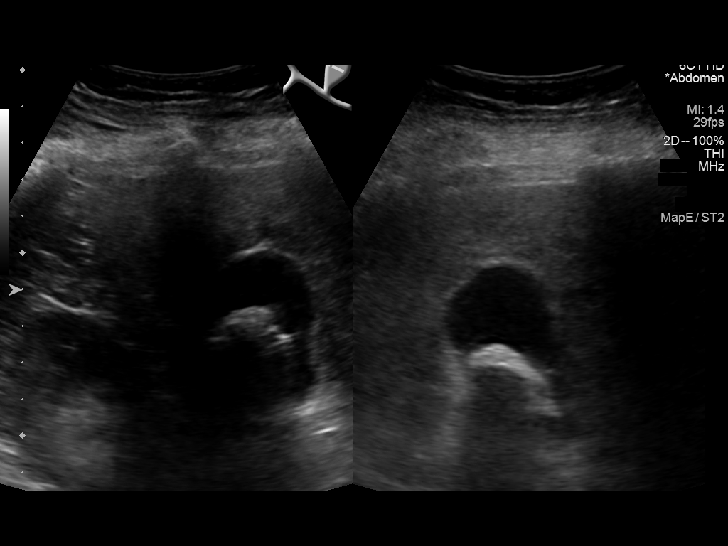
[im 9/49]
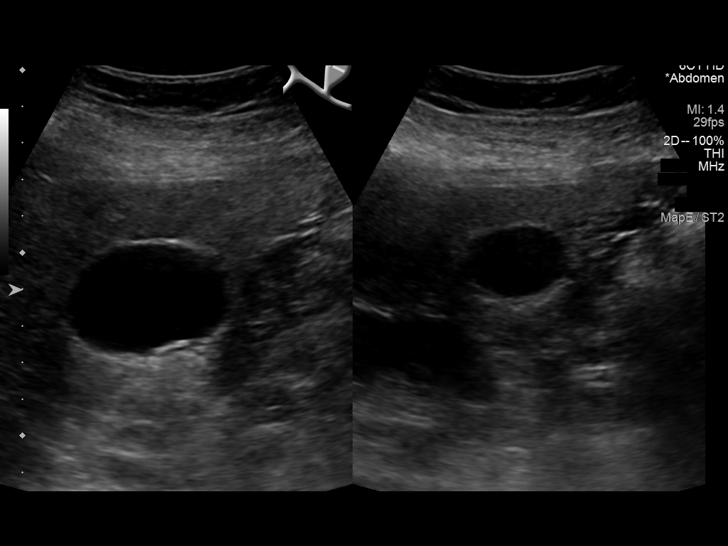
[im 13/49]
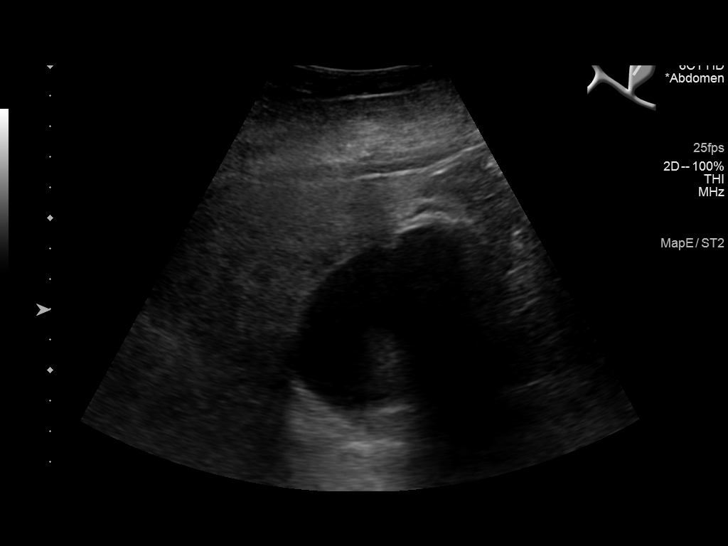
[im 17/49]
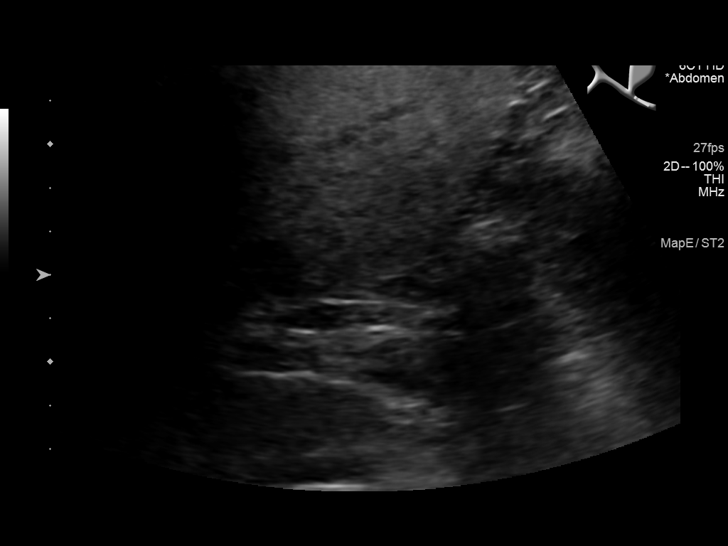
[im 19/49]
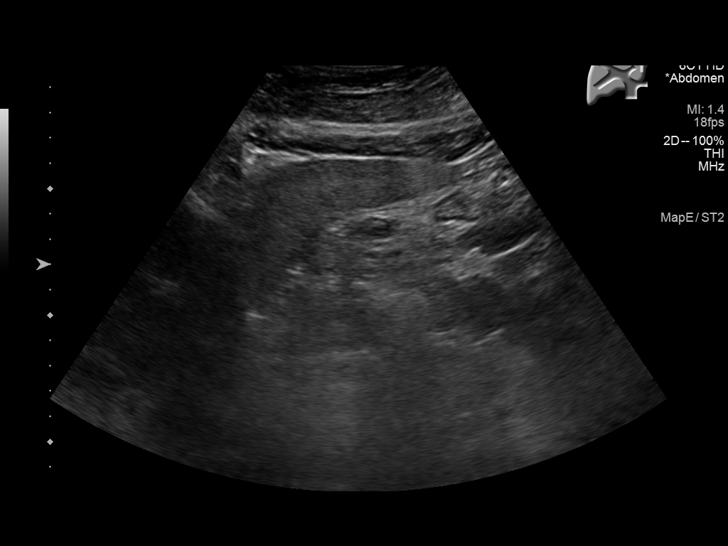
[im 23/49]
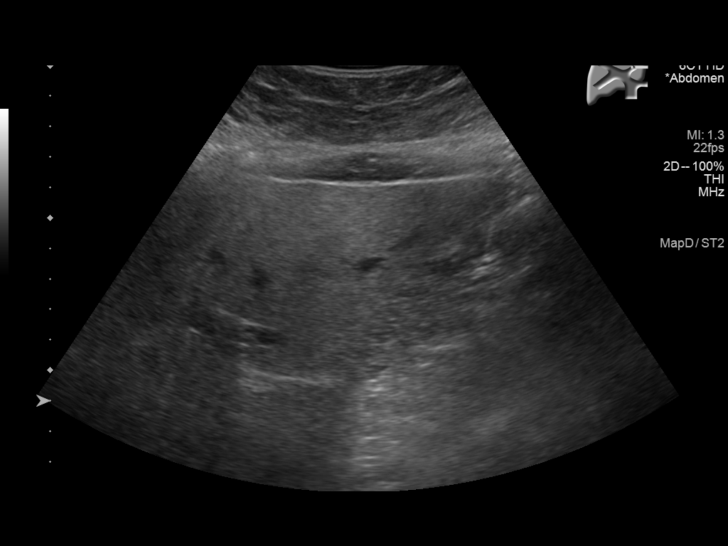
[im 27/49]
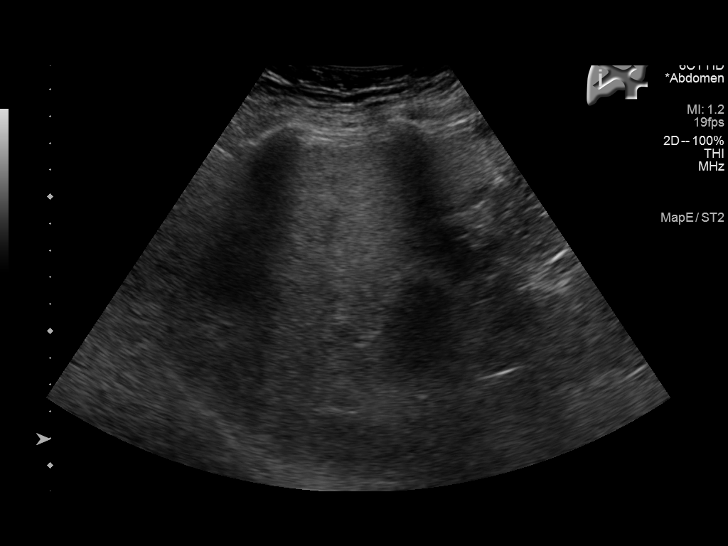
[im 31/49]
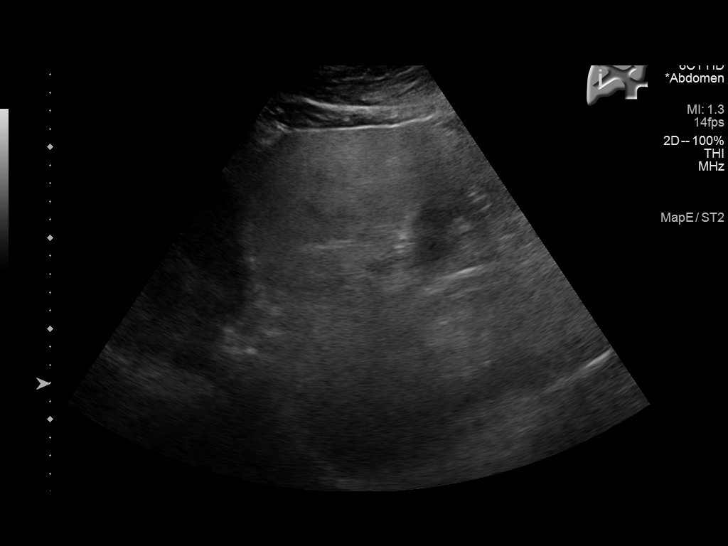
[im 33/49]
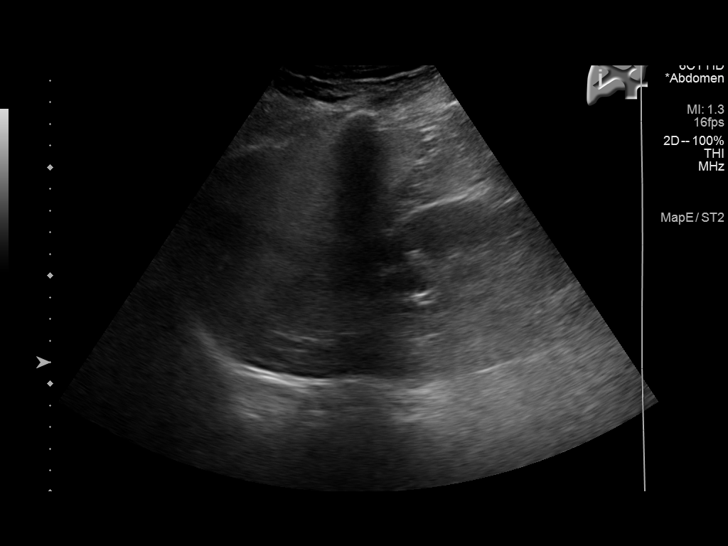
[im 37/49]
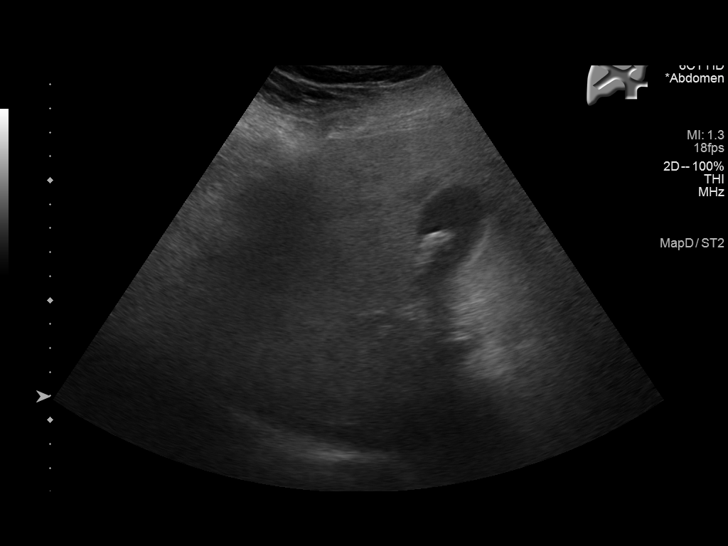
[im 41/49]
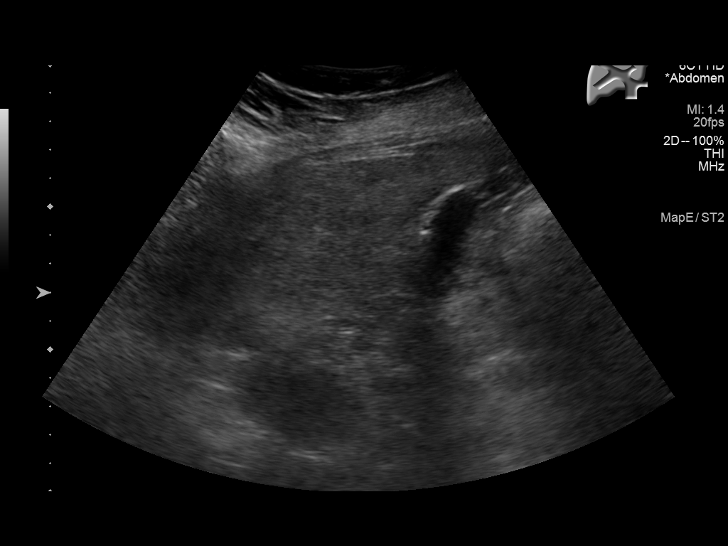
[im 45/49]
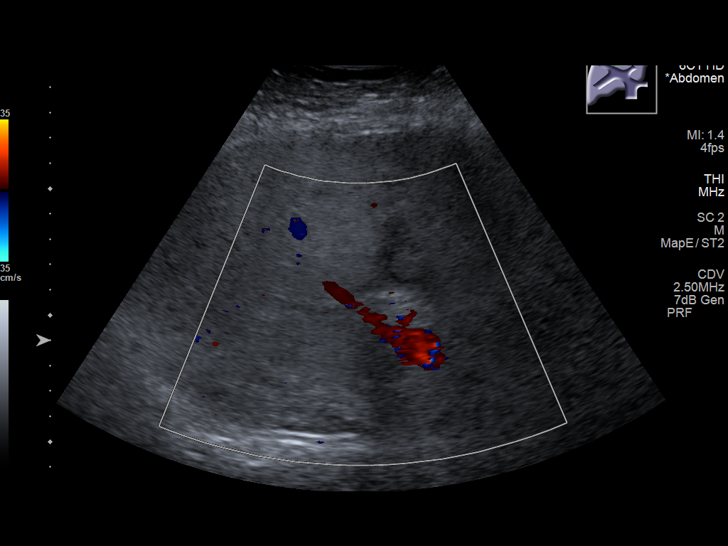
[im 49/49]
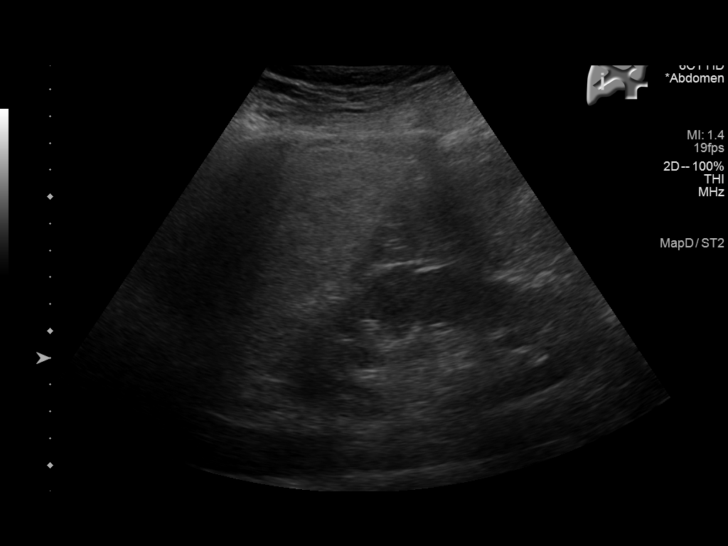

[14 of 25 positions shown; findings below may reference images not displayed]

FINDINGS: Gallbladder:

Cholelithiasis. No gallbladder wall thickening or pericholecystic
fluid. Sonographic Murphy sign is negative per technologist.

Common bile duct:

Diameter: 6 mm

Liver:

No focal lesion.

Diffusely increased parenchymal echogenicity.

Portal vein is patent on color Doppler imaging with normal direction
of blood flow towards the liver.

Other: None.
IMPRESSION: 1. Cholelithiasis without additional sonographic evidence of acute
cholecystitis.
2. Diffuse increased echogenicity of the hepatic parenchyma is a
nonspecific indicator of hepatocellular dysfunction, most commonly
steatosis.

## 2021-08-09 ENCOUNTER — Ambulatory Visit
Admission: RE | Admit: 2021-08-09 | Discharge: 2021-08-09 | Disposition: A | Discharge status: Home / Self Care | Payer: Medicare Other | Attending: Urology | Admitting: Urology

## 2021-08-09 ENCOUNTER — Other Ambulatory Visit: Payer: Self-pay

## 2021-08-09 ENCOUNTER — Encounter: Payer: Self-pay | Admitting: Urology

## 2021-08-09 ENCOUNTER — Ambulatory Visit: Payer: Medicare Other | Admitting: Urology

## 2021-08-09 ENCOUNTER — Ambulatory Visit
Admission: RE | Admit: 2021-08-09 | Discharge: 2021-08-09 | Disposition: A | Discharge status: Home / Self Care | Payer: Medicare Other | Source: Ambulatory Visit | Attending: Urology | Admitting: Urology

## 2021-08-09 ENCOUNTER — Ambulatory Visit (INDEPENDENT_AMBULATORY_CARE_PROVIDER_SITE_OTHER): Payer: Medicare Other | Admitting: Urology

## 2021-08-09 VITALS — BP 140/70 | HR 78 | Ht 65.0 in | Wt 220.0 lb

## 2021-08-09 DIAGNOSIS — N200 Calculus of kidney: Secondary | ICD-10-CM

## 2021-08-09 NOTE — Progress Notes (Signed)
08/09/2021 1:55 PM   Sarah Brady 23-May-1951 [id number]  Referring provider: Enid Baas, MD [address] [place],  [place] [address]  Chief Complaint  Patient presents with   Nephrolithiasis    Urologic history:  1. Nephrolithiasis -Bilateral ureteral stent placement 10/2018 for bilateral obstructing UPJ calculi with pyelonephritis -SWL 03/02/2019 and 04/01/2019 -Stent removal 04/11/2019 -CT 10/2018 showed additional small, bilateral nonobstructing renal calculi. -Metabolic evaluation remarkable for low urine volume, hypocitraturia and low magnesium  HPI: 70 y.o. 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   CORONARY ARTERY BYPASS GRAFT     CYSTOSCOPY WITH STENT PLACEMENT Bilateral 11/01/2018   Procedure: CYSTOSCOPY WITH STENT PLACEMENT;  Surgeon: Riki Altes, MD;  Location: [facility];  Service: Urology;  Laterality: Bilateral;   EXTRACORPOREAL SHOCK WAVE LITHOTRIPSY Left 01/31/2019   Procedure: EXTRACORPOREAL SHOCK WAVE LITHOTRIPSY (ESWL);  Surgeon: Sondra Come, MD;  Location: [facility];  Service: Urology;  Laterality: Left;   EXTRACORPOREAL SHOCK WAVE LITHOTRIPSY Left 02/28/2019   Procedure: EXTRACORPOREAL SHOCK WAVE LITHOTRIPSY (ESWL);  Surgeon: Riki Altes, MD;  Location: [facility];  Service: Urology;  Laterality: Left;   EXTRACORPOREAL SHOCK WAVE LITHOTRIPSY Right 03/28/2019   Procedure: EXTRACORPOREAL SHOCK WAVE  LITHOTRIPSY (ESWL);  Surgeon: Vanna Scotland, MD;  Location: [facility];  Service: Urology;  Laterality: Right;   TUBAL LIGATION      Home Medications:  Allergies as of 08/09/2021   No Known Allergies      Medication List        Accurate as of August 09, 2021  1:55 PM. If you have any questions, ask your nurse or doctor.          aspirin 81 MG chewable tablet Chew 1 tablet (81 mg total) by mouth daily with breakfast.   atorvastatin 80 MG tablet Commonly known as: LIPITOR Take 80 mg by mouth daily.   furosemide 40 MG tablet Commonly known as: LASIX Take 40 mg by mouth 2 (two) times daily.   levothyroxine 125 MCG tablet Commonly known as: SYNTHROID Take 125 mcg by mouth daily.   metFORMIN 500 MG 24 hr tablet Commonly known as: GLUCOPHAGE-XR Take 1,000 mg by mouth daily. With largest meal of the day.   metoprolol tartrate 50 MG tablet Commonly known as: LOPRESSOR Take 1 tablet (50 mg total) by mouth 2 (two) times daily.   potassium chloride 10 MEQ tablet Commonly known as: KLOR-CON Take 10 mEq by mouth 2 (two) times daily.   tamsulosin 0.4 MG Caps capsule Commonly known as: Flomax Take 1 capsule (0.4 mg total) by mouth daily after supper.        Allergies: No Known Allergies  Family History: No family history on file.  Social History:  reports that she has quit smoking. She has never used smokeless tobacco. She reports current alcohol use of about 1.0 standard drink per week. She reports that she does not currently use drugs.   Physical Exam:  Ht 5\' 5"  (1.651 m)   Wt 220 lb (99.8 kg)   BMI 36.61 kg/m   Constitutional:  Alert and oriented, No acute distress. HEENT: [place] AT, moist mucus membranes.  Trachea midline, no masses. Cardiovascular: No clubbing, cyanosis, or edema. Respiratory: Normal respiratory effort, no increased work of breathing. Psychiatric: Normal mood and affect.    Pertinent Imaging: KUB images performed today were personally  reviewed and interpreted   Assessment & Plan:    1.  Right nephrolithiasis Asymptomatic Continue surveillance Follow-up 1 year with KUB or earlier for development of flank pain/renal colic   Abbie Sons, MD  [facility] [address] [place], [place] [address] [phone]

## 2022-08-08 ENCOUNTER — Other Ambulatory Visit: Payer: Self-pay | Admitting: *Deleted

## 2022-08-08 ENCOUNTER — Telehealth: Payer: Self-pay | Admitting: Urology

## 2022-08-08 DIAGNOSIS — N200 Calculus of kidney: Secondary | ICD-10-CM

## 2022-08-08 NOTE — Telephone Encounter (Signed)
Patient has 1 year follow up w/ kub on 08/10/22; need order put in for this.

## 2022-08-08 NOTE — Telephone Encounter (Signed)
Order in.

## 2022-08-10 ENCOUNTER — Ambulatory Visit
Admission: RE | Admit: 2022-08-10 | Discharge: 2022-08-10 | Disposition: A | Discharge status: Home / Self Care | Payer: Medicare Other | Source: Ambulatory Visit | Attending: Urology | Admitting: Urology

## 2022-08-10 ENCOUNTER — Encounter: Payer: Self-pay | Admitting: Urology

## 2022-08-10 ENCOUNTER — Ambulatory Visit (INDEPENDENT_AMBULATORY_CARE_PROVIDER_SITE_OTHER): Payer: Medicare Other | Admitting: Urology

## 2022-08-10 VITALS — BP 110/72 | HR 69 | Ht 65.0 in | Wt 210.0 lb

## 2022-08-10 DIAGNOSIS — N200 Calculus of kidney: Secondary | ICD-10-CM

## 2022-08-10 NOTE — Progress Notes (Signed)
08/10/2022 1:03 PM   Sarah Brady 04/20/1951 [id number]  Referring provider: Enid Baas, MD [address] [place],  [place] [address]  Chief Complaint  Patient presents with   Nephrolithiasis    Urologic history:  1. Nephrolithiasis -Bilateral ureteral stent placement 10/2018 for bilateral obstructing UPJ calculi with pyelonephritis -SWL 03/02/2019 and 04/01/2019 -Stent removal 04/11/2019 -CT 10/2018 showed additional small, bilateral nonobstructing renal calculi. -Metabolic evaluation remarkable for low urine volume, hypocitraturia and low magnesium  HPI: 71 y.o. 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   CORONARY ARTERY BYPASS GRAFT     CYSTOSCOPY WITH STENT PLACEMENT Bilateral 11/01/2018   Procedure: CYSTOSCOPY WITH STENT PLACEMENT;  Surgeon: Riki Altes, MD;  Location: [facility];  Service: Urology;  Laterality: Bilateral;   EXTRACORPOREAL SHOCK WAVE LITHOTRIPSY Left 01/31/2019   Procedure: EXTRACORPOREAL SHOCK WAVE LITHOTRIPSY (ESWL);  Surgeon: Sondra Come, MD;  Location: [facility];  Service: Urology;  Laterality: Left;   EXTRACORPOREAL SHOCK WAVE LITHOTRIPSY Left 02/28/2019   Procedure: EXTRACORPOREAL SHOCK WAVE LITHOTRIPSY (ESWL);  Surgeon: Riki Altes, MD;  Location: [facility];  Service: Urology;  Laterality: Left;   EXTRACORPOREAL SHOCK WAVE LITHOTRIPSY Right 03/28/2019   Procedure: EXTRACORPOREAL SHOCK WAVE LITHOTRIPSY (ESWL);  Surgeon: Vanna Scotland, MD;   Location: [facility];  Service: Urology;  Laterality: Right;   TUBAL LIGATION      Home Medications:  Allergies as of 08/10/2022   No Known Allergies      Medication List        Accurate as of August 10, 2022  1:03 PM. If you have any questions, ask your nurse or doctor.          STOP taking these medications    aspirin 81 MG chewable tablet Stopped by: Riki Altes, MD   tamsulosin 0.4 MG Caps capsule Commonly known as: Flomax Stopped by: Riki Altes, MD       TAKE these medications    atorvastatin 80 MG tablet Commonly known as: LIPITOR Take 80 mg by mouth daily.   furosemide 40 MG tablet Commonly known as: LASIX Take 40 mg by mouth 2 (two) times daily.   levothyroxine 125 MCG tablet Commonly known as: SYNTHROID Take 125 mcg by mouth daily.   metFORMIN 500 MG 24 hr tablet Commonly known as: GLUCOPHAGE-XR Take 1,000 mg by mouth daily. With largest meal of the day.   metoprolol tartrate 50 MG tablet Commonly known as: LOPRESSOR Take 1 tablet (50 mg total) by mouth 2 (two) times daily.   potassium chloride 10 MEQ tablet Commonly known as: KLOR-CON M Take 10 mEq by mouth 2 (two) times daily.        Allergies: No Known Allergies  Family History: History reviewed. No pertinent family history.  Social History:  reports that she has quit smoking. She has never used smokeless tobacco. She reports current alcohol use of about 1.0 standard drink of alcohol per week. She reports that she does not currently use drugs.  Physical Exam: BP 110/72   Pulse 69   Ht 5\' 5"  (1.651 m)   Wt 210 lb (95.3 kg)   BMI 34.95 kg/m   Constitutional:  Alert and oriented, No acute distress. HEENT:  AT Respiratory: Normal respiratory effort, no increased work of breathing. Psychiatric: Normal mood and affect.    Pertinent Imaging: KUB images performed today were personally reviewed and interpreted.  The right lower pole calculus is stable.  There is a new  calcification overlying the upper portion of the left renal outline measuring 5 mm   Assessment & Plan:    1.  Nephrolithiasis Asymptomatic Stable right renal calculus 5 mm calcification overlying the left renal outline which was not present on previous imaging We discussed scheduling a renal ultrasound versus repeating the KUB in a few weeks to see if still present.  She has elected the latter and will do after the first of the year.  Will call with results Continue annual follow-up   , MD  [facility] [address] [place], [place] [id number]

## 2022-12-28 ENCOUNTER — Other Ambulatory Visit: Payer: Self-pay | Admitting: Internal Medicine

## 2022-12-28 DIAGNOSIS — Z1231 Encounter for screening mammogram for malignant neoplasm of breast: Secondary | ICD-10-CM

## 2023-08-04 ENCOUNTER — Encounter: Payer: Self-pay | Admitting: Urology

## 2023-08-09 ENCOUNTER — Ambulatory Visit
Admission: RE | Admit: 2023-08-09 | Discharge: 2023-08-09 | Disposition: A | Discharge status: Home / Self Care | Payer: Medicare Other | Attending: Urology | Admitting: Urology

## 2023-08-09 ENCOUNTER — Ambulatory Visit: Payer: Medicare Other | Admitting: Urology

## 2023-08-09 ENCOUNTER — Ambulatory Visit
Admission: RE | Admit: 2023-08-09 | Discharge: 2023-08-09 | Disposition: A | Discharge status: Home / Self Care | Payer: Medicare Other | Source: Ambulatory Visit | Attending: Urology | Admitting: Urology

## 2023-08-09 VITALS — BP 119/78 | HR 84 | Ht 65.0 in | Wt 180.0 lb

## 2023-08-09 DIAGNOSIS — N200 Calculus of kidney: Secondary | ICD-10-CM | POA: Diagnosis present

## 2023-08-09 NOTE — Progress Notes (Signed)
I, Maysun Anabel Bene, acting as a scribe for Riki Altes, MD., have documented all relevant documentation on the behalf of Riki Altes, MD, as directed by Riki Altes, MD while in the presence of Riki Altes, MD.  08/09/2023 2:11 PM   Sarah Brady 02-05-1951 [id number]  Referring provider: Enid Baas, MD [address] [place],  [place] [address]  Chief Complaint  Patient presents with   Nephrolithiasis   Urologic history: 1. Nephrolithiasis Bilateral ureteral stent placement 10/2018 for bilateral obstructing UPJ calculi with pyelonephritis SWL 03/02/2019 and 04/01/2019 Stent removal 04/11/2019 CT 10/2018 showed additional small, bilateral nonobstructing renal calculi. Metabolic evaluation remarkable for low urine volume, hypocitraturia and low magnesium  HPI: Sarah Brady is a 72 y.o. female presents for annual follow-up.   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   CORONARY ARTERY BYPASS GRAFT     CYSTOSCOPY WITH STENT PLACEMENT Bilateral 11/01/2018   Procedure: CYSTOSCOPY WITH STENT PLACEMENT;  Surgeon: Riki Altes, MD;  Location: [facility];  Service: Urology;  Laterality: Bilateral;   EXTRACORPOREAL SHOCK WAVE LITHOTRIPSY Left 01/31/2019   Procedure: EXTRACORPOREAL SHOCK WAVE LITHOTRIPSY (ESWL);  Surgeon: Sondra Come, MD;  Location: [facility];  Service: Urology;  Laterality: Left;   EXTRACORPOREAL SHOCK WAVE LITHOTRIPSY Left 02/28/2019   Procedure: EXTRACORPOREAL SHOCK WAVE LITHOTRIPSY (ESWL);   Surgeon: Riki Altes, MD;  Location: [facility];  Service: Urology;  Laterality: Left;   EXTRACORPOREAL SHOCK WAVE LITHOTRIPSY Right 03/28/2019   Procedure: EXTRACORPOREAL SHOCK WAVE LITHOTRIPSY (ESWL);  Surgeon: Vanna Scotland, MD;  Location: [facility];  Service: Urology;  Laterality: Right;   TUBAL LIGATION      Home Medications:  Allergies as of 08/09/2023   No Known Allergies      Medication List        Accurate as of August 09, 2023  2:11 PM. If you have any questions, ask your nurse or doctor.          atorvastatin 80 MG tablet Commonly known as: LIPITOR Take 80 mg by mouth daily.   furosemide 40 MG tablet Commonly known as: LASIX Take 40 mg by mouth 2 (two) times daily.   levothyroxine 125 MCG tablet Commonly known as: SYNTHROID Take 125 mcg by mouth daily.   metFORMIN 500 MG 24 hr tablet Commonly known as: GLUCOPHAGE-XR Take 1,000 mg by mouth daily. With largest meal of the day.   metoprolol tartrate 50 MG tablet Commonly known as: LOPRESSOR Take 1 tablet (50 mg total) by mouth 2 (two) times daily.   potassium chloride 10 MEQ tablet Commonly known as: KLOR-CON M Take 10 mEq by mouth 2 (two) times daily.        Allergies: No Known Allergies  Social History:  reports that she has quit smoking. She has never used smokeless tobacco. She reports current alcohol use of about 1.0 standard drink of alcohol per week. She reports that she does not currently use drugs.   Physical Exam: BP 119/78   Pulse  84   Ht 5\' 5"  (1.651 m)   Wt 180 lb (81.6 kg)   BMI 29.95 kg/m   Constitutional:  Alert and oriented, No acute distress. HEENT: [place] AT, moist mucus membranes.  Trachea midline, no masses. Cardiovascular: No clubbing, cyanosis, or edema. Respiratory: Normal respiratory effort, no increased work of breathing. GI: Abdomen is soft, nontender, nondistended, no abdominal masses Skin: No rashes, bruises or suspicious lesions. Neurologic: Grossly intact, no  focal deficits, moving all 4 extremities. Psychiatric: Normal mood and affect.   Pertinent Imaging: KUB performed earlier today was personally reviewed and interpreted. There's calcification overlying the right 12th rib, which is stable. No other calcifications suspicious for urinary tract stones are identified.    Assessment & Plan:    1. Nephrolithiasis Remains asymptomatic Stable right renal calculus on KUB She will follow up in 2 years with a KUB and instructed to call earlier for any recurrent stone symptoms.  [facility] [address] [place], [place] [address] [phone]

## 2023-08-13 ENCOUNTER — Encounter: Payer: Self-pay | Admitting: Urology

## 2023-10-18 DIAGNOSIS — I10 Essential (primary) hypertension: Secondary | ICD-10-CM | POA: Diagnosis not present

## 2023-10-18 DIAGNOSIS — E1169 Type 2 diabetes mellitus with other specified complication: Secondary | ICD-10-CM | POA: Diagnosis not present

## 2023-10-18 DIAGNOSIS — I2510 Atherosclerotic heart disease of native coronary artery without angina pectoris: Secondary | ICD-10-CM | POA: Diagnosis not present

## 2023-10-18 DIAGNOSIS — E876 Hypokalemia: Secondary | ICD-10-CM | POA: Diagnosis not present

## 2023-10-18 DIAGNOSIS — G4700 Insomnia, unspecified: Secondary | ICD-10-CM | POA: Diagnosis not present

## 2023-10-18 DIAGNOSIS — I509 Heart failure, unspecified: Secondary | ICD-10-CM | POA: Diagnosis not present

## 2023-10-18 DIAGNOSIS — E261 Secondary hyperaldosteronism: Secondary | ICD-10-CM | POA: Diagnosis not present

## 2023-10-18 DIAGNOSIS — E785 Hyperlipidemia, unspecified: Secondary | ICD-10-CM | POA: Diagnosis not present

## 2023-10-18 DIAGNOSIS — E039 Hypothyroidism, unspecified: Secondary | ICD-10-CM | POA: Diagnosis not present

## 2023-10-18 DIAGNOSIS — E669 Obesity, unspecified: Secondary | ICD-10-CM | POA: Diagnosis not present

## 2023-10-18 DIAGNOSIS — I252 Old myocardial infarction: Secondary | ICD-10-CM | POA: Diagnosis not present

## 2023-10-18 DIAGNOSIS — I110 Hypertensive heart disease with heart failure: Secondary | ICD-10-CM | POA: Diagnosis not present

## 2023-11-02 ENCOUNTER — Inpatient Hospital Stay: Admission: RE | Admit: 2023-11-02 | Payer: Medicare Other | Source: Ambulatory Visit

## 2023-11-07 ENCOUNTER — Ambulatory Visit
Admission: RE | Admit: 2023-11-07 | Discharge: 2023-11-07 | Disposition: A | Discharge status: Home / Self Care | Payer: PPO | Source: Ambulatory Visit | Attending: Internal Medicine | Admitting: Internal Medicine

## 2023-11-07 DIAGNOSIS — Z1231 Encounter for screening mammogram for malignant neoplasm of breast: Secondary | ICD-10-CM | POA: Diagnosis not present

## 2023-11-09 ENCOUNTER — Inpatient Hospital Stay
Admission: RE | Admit: 2023-11-09 | Discharge: 2023-11-09 | Disposition: A | Discharge status: Home / Self Care | Payer: Self-pay | Source: Ambulatory Visit | Attending: Internal Medicine | Admitting: Internal Medicine

## 2023-11-09 ENCOUNTER — Other Ambulatory Visit: Payer: Self-pay | Admitting: *Deleted

## 2023-11-09 DIAGNOSIS — Z1231 Encounter for screening mammogram for malignant neoplasm of breast: Secondary | ICD-10-CM

## 2023-11-10 ENCOUNTER — Other Ambulatory Visit: Payer: Self-pay | Admitting: Internal Medicine

## 2023-11-10 DIAGNOSIS — R928 Other abnormal and inconclusive findings on diagnostic imaging of breast: Secondary | ICD-10-CM

## 2023-11-10 DIAGNOSIS — R921 Mammographic calcification found on diagnostic imaging of breast: Secondary | ICD-10-CM

## 2023-11-15 ENCOUNTER — Ambulatory Visit
Admission: RE | Admit: 2023-11-15 | Discharge: 2023-11-15 | Disposition: A | Discharge status: Home / Self Care | Payer: PPO | Source: Ambulatory Visit | Attending: Internal Medicine | Admitting: Internal Medicine

## 2023-11-15 DIAGNOSIS — R928 Other abnormal and inconclusive findings on diagnostic imaging of breast: Secondary | ICD-10-CM | POA: Insufficient documentation

## 2023-11-15 DIAGNOSIS — R92322 Mammographic fibroglandular density, left breast: Secondary | ICD-10-CM | POA: Diagnosis not present

## 2023-11-15 DIAGNOSIS — R921 Mammographic calcification found on diagnostic imaging of breast: Secondary | ICD-10-CM | POA: Diagnosis not present

## 2023-11-20 ENCOUNTER — Other Ambulatory Visit: Payer: Self-pay | Admitting: Internal Medicine

## 2023-11-20 DIAGNOSIS — R921 Mammographic calcification found on diagnostic imaging of breast: Secondary | ICD-10-CM

## 2023-11-20 DIAGNOSIS — R928 Other abnormal and inconclusive findings on diagnostic imaging of breast: Secondary | ICD-10-CM

## 2023-11-22 ENCOUNTER — Ambulatory Visit
Admission: RE | Admit: 2023-11-22 | Discharge: 2023-11-22 | Disposition: A | Discharge status: Home / Self Care | Source: Ambulatory Visit | Attending: Internal Medicine | Admitting: Internal Medicine

## 2023-11-22 DIAGNOSIS — R928 Other abnormal and inconclusive findings on diagnostic imaging of breast: Secondary | ICD-10-CM | POA: Insufficient documentation

## 2023-11-22 DIAGNOSIS — N6489 Other specified disorders of breast: Secondary | ICD-10-CM | POA: Insufficient documentation

## 2023-11-22 DIAGNOSIS — R921 Mammographic calcification found on diagnostic imaging of breast: Secondary | ICD-10-CM | POA: Insufficient documentation

## 2023-11-22 HISTORY — PX: BREAST BIOPSY: SHX20

## 2023-11-22 MED ORDER — LIDOCAINE-EPINEPHRINE 1 %-1:100000 IJ SOLN
20.0000 mL | Freq: Once | INTRAMUSCULAR | Status: AC
Start: 2023-11-22 — End: 2023-11-22
  Administered 2023-11-22: 20 mL
  Filled 2023-11-22: qty 20

## 2023-11-22 MED ORDER — LIDOCAINE 1 % OPTIME INJ - NO CHARGE
5.0000 mL | Freq: Once | INTRAMUSCULAR | Status: AC
  Administered 2023-11-22: 5 mL
  Filled 2023-11-22: qty 6

## 2023-11-23 LAB — SURGICAL PATHOLOGY

## 2024-05-30 DIAGNOSIS — J189 Pneumonia, unspecified organism: Secondary | ICD-10-CM | POA: Diagnosis not present

## 2024-05-30 DIAGNOSIS — R509 Fever, unspecified: Secondary | ICD-10-CM | POA: Diagnosis not present

## 2024-05-30 DIAGNOSIS — J9801 Acute bronchospasm: Secondary | ICD-10-CM | POA: Diagnosis not present

## 2024-05-30 DIAGNOSIS — R051 Acute cough: Secondary | ICD-10-CM | POA: Diagnosis not present

## 2024-06-11 DIAGNOSIS — E538 Deficiency of other specified B group vitamins: Secondary | ICD-10-CM | POA: Diagnosis not present

## 2024-06-11 DIAGNOSIS — E039 Hypothyroidism, unspecified: Secondary | ICD-10-CM | POA: Diagnosis not present

## 2024-06-11 DIAGNOSIS — N3001 Acute cystitis with hematuria: Secondary | ICD-10-CM | POA: Diagnosis not present

## 2024-06-11 DIAGNOSIS — E559 Vitamin D deficiency, unspecified: Secondary | ICD-10-CM | POA: Diagnosis not present

## 2024-06-11 DIAGNOSIS — E119 Type 2 diabetes mellitus without complications: Secondary | ICD-10-CM | POA: Diagnosis not present

## 2024-06-18 DIAGNOSIS — E11620 Type 2 diabetes mellitus with diabetic dermatitis: Secondary | ICD-10-CM | POA: Diagnosis not present

## 2024-06-18 DIAGNOSIS — N390 Urinary tract infection, site not specified: Secondary | ICD-10-CM | POA: Diagnosis not present

## 2024-06-18 DIAGNOSIS — E119 Type 2 diabetes mellitus without complications: Secondary | ICD-10-CM | POA: Diagnosis not present

## 2024-06-18 DIAGNOSIS — E559 Vitamin D deficiency, unspecified: Secondary | ICD-10-CM | POA: Diagnosis not present

## 2024-06-18 DIAGNOSIS — E039 Hypothyroidism, unspecified: Secondary | ICD-10-CM | POA: Diagnosis not present

## 2024-06-18 DIAGNOSIS — Z1331 Encounter for screening for depression: Secondary | ICD-10-CM | POA: Diagnosis not present

## 2024-06-18 DIAGNOSIS — Z23 Encounter for immunization: Secondary | ICD-10-CM | POA: Diagnosis not present

## 2024-06-18 DIAGNOSIS — Z87891 Personal history of nicotine dependence: Secondary | ICD-10-CM | POA: Diagnosis not present

## 2024-06-18 DIAGNOSIS — Z0000 Encounter for general adult medical examination without abnormal findings: Secondary | ICD-10-CM | POA: Diagnosis not present

## 2024-06-21 ENCOUNTER — Other Ambulatory Visit: Payer: Self-pay | Admitting: Internal Medicine

## 2024-06-21 DIAGNOSIS — Z0000 Encounter for general adult medical examination without abnormal findings: Secondary | ICD-10-CM

## 2024-06-21 DIAGNOSIS — F17210 Nicotine dependence, cigarettes, uncomplicated: Secondary | ICD-10-CM

## 2024-06-25 DIAGNOSIS — I2510 Atherosclerotic heart disease of native coronary artery without angina pectoris: Secondary | ICD-10-CM | POA: Diagnosis not present

## 2024-06-25 DIAGNOSIS — E876 Hypokalemia: Secondary | ICD-10-CM | POA: Diagnosis not present

## 2024-06-25 DIAGNOSIS — I25810 Atherosclerosis of coronary artery bypass graft(s) without angina pectoris: Secondary | ICD-10-CM | POA: Diagnosis not present

## 2025-07-16 ENCOUNTER — Ambulatory Visit: Admitting: Urology
